# Patient Record
Sex: Male | Born: 1945 | Race: White | Hispanic: No | Marital: Married | Smoking: Former smoker
Health system: Southern US, Community
[De-identification: ages and names within clinical notes are randomized; demographics above are authoritative.]

## PROBLEM LIST (undated history)

## (undated) DIAGNOSIS — I82409 Acute embolism and thrombosis of unspecified deep veins of unspecified lower extremity: Secondary | ICD-10-CM

## (undated) DIAGNOSIS — I10 Essential (primary) hypertension: Secondary | ICD-10-CM

## (undated) DIAGNOSIS — E119 Type 2 diabetes mellitus without complications: Secondary | ICD-10-CM

## (undated) DIAGNOSIS — E785 Hyperlipidemia, unspecified: Secondary | ICD-10-CM

## (undated) DIAGNOSIS — G4730 Sleep apnea, unspecified: Secondary | ICD-10-CM

## (undated) DIAGNOSIS — K219 Gastro-esophageal reflux disease without esophagitis: Secondary | ICD-10-CM

## (undated) DIAGNOSIS — N390 Urinary tract infection, site not specified: Secondary | ICD-10-CM

## (undated) DIAGNOSIS — A4902 Methicillin resistant Staphylococcus aureus infection, unspecified site: Secondary | ICD-10-CM

## (undated) DIAGNOSIS — N529 Male erectile dysfunction, unspecified: Secondary | ICD-10-CM

## (undated) DIAGNOSIS — N400 Enlarged prostate without lower urinary tract symptoms: Secondary | ICD-10-CM

## (undated) DIAGNOSIS — H409 Unspecified glaucoma: Secondary | ICD-10-CM

## (undated) DIAGNOSIS — Z87442 Personal history of urinary calculi: Secondary | ICD-10-CM

## (undated) HISTORY — PX: COLONOSCOPY: SHX174

## (undated) HISTORY — PX: EYE SURGERY: SHX253

## (undated) HISTORY — PX: OTHER SURGICAL HISTORY: SHX169

---
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
## 2008-09-25 HISTORY — PX: PENILE PROSTHESIS IMPLANT: SHX240

## 2012-02-20 HISTORY — PX: EYE SURGERY: SHX253

## 2014-01-20 HISTORY — PX: EYE SURGERY: SHX253

## 2016-09-25 DIAGNOSIS — I82409 Acute embolism and thrombosis of unspecified deep veins of unspecified lower extremity: Secondary | ICD-10-CM

## 2016-09-25 HISTORY — DX: Acute embolism and thrombosis of unspecified deep veins of unspecified lower extremity: I82.409

## 2019-04-22 ENCOUNTER — Other Ambulatory Visit: Payer: Self-pay | Admitting: Urology

## 2019-04-22 DIAGNOSIS — R310 Gross hematuria: Secondary | ICD-10-CM

## 2019-04-22 DIAGNOSIS — R972 Elevated prostate specific antigen [PSA]: Secondary | ICD-10-CM

## 2019-04-30 ENCOUNTER — Ambulatory Visit: Payer: Self-pay

## 2019-05-02 ENCOUNTER — Ambulatory Visit: Payer: 59

## 2019-05-05 ENCOUNTER — Ambulatory Visit: Payer: 59

## 2019-07-04 ENCOUNTER — Inpatient Hospital Stay: Admission: RE | Admit: 2019-07-04 | Payer: 59 | Source: Ambulatory Visit

## 2019-07-09 NOTE — H&P (Signed)
NAME: Christopher Collier, Christopher Collier MEDICAL RECORD [id number] ACCOUNT [id number] DATE OF BIRTH:[birth year] FACILITY: [facility] LOCATION:  PHYSICIAN:Breia Ocampo Farrel Conners, MD  HISTORY AND PHYSICAL  DATE OF ADMISSION:  07/17/2019  CHIEF COMPLAINT:  Urinary retention and gross hematuria.  HISTORY OF PRESENT ILLNESS:  The patient is a 73 year old African-American male who presented to the office in July with a 46-month history of gross intermittent hematuria.  He also has had a long history of difficulty voiding with significant obstructive  symptoms.  He actually developed urinary retention and is performing intermittent self-cath.  Evaluation in the office included an elevated PSA of 5.6 ng/mL.  Cystoscopy on 08/19 indicated a 4 cm prostatic urethra with papillary lesions of the prostatic  urethra measuring 5 mm in size each.  Upper tract evaluation with CT scan on December 11th, 2020 indicated normal kidneys without stone, mass or obstruction.  He had some tiny renal calculi present.  He had some renal cysts.  The prostate appeared  heterogenous with enlargement and there appeared to be a fluid collection along the left side of the prostate.    Further evaluation with prostate ultrasound on 08/14 indicated an 83 gram prostate with a cystic lesion to the left side of the prostate.  Prostate gland MRI scan was then performed on 09/11 indicating a 107.9 gram prostate with a PI-RADS category 5  lesion measuring 4.9 cm in the left posterior lateral region extending from the base to the apex.  It also appeared to involve the urethra and appeared to penetrate the capsule.  There was also a 4 cm cystic structure to the left side of the prostate in  the rectovesical pouch of uncertain etiology.  Exosome IntelliScore was 33.3, which was above the cutoff for higher risk of grade prostate cancer.  The patient comes in now for UroNav fusion biopsy of the prostate, transurethral biopsy of the prostatic  urethral lesions and  transurethral resection of the prostate.  ALLERGIES:  THE PATIENT IS ALLERGIC TO LOTENSIN.    CURRENT MEDICATIONS:  Insulin, metoprolol, omeprazole, Xarelto and alfuzosin.  PAST SURGICAL HISTORY:  Cataract surgery 2013, placement of inflatable penile prosthesis 2010, cataract surgery 2015.  PAST AND CURRENT MEDICAL CONDITIONS:   1.  Hypertension. 2.  Hypercholesterolemia. 3.  Diabetes. 4.  History of kidney stones. 5.  Peripheral vascular disease. 6.  Sleep apnea. 7.  Obesity. 8.  Hypertension.  REVIEW OF SYSTEMS:  The patient has allergic rhinitis and decreased auditory acuity.  He has numbness and tingling of his feet.  He has some fatigue and weakness.  He denies chest pain or shortness of breath.  SOCIAL HISTORY:  The patient consumes 4 to 5 alcoholic beverages per week.  He quit smoking in 1971 with a 4-pack-year history.  FAMILY HISTORY:  Negative for prostate cancer and urological disease.  PHYSICAL EXAMINATION: VITAL SIGNS:  Height 5 feet 6 inches, weight 158, BMI 34. GENERAL:  A well-nourished African-American male in no distress. HEENT:  Sclerae were clear.  Pupils are equally round, reactive to light and accommodation. NECK:  No palpable cervical adenopathy. PULMONARY:  Lungs are clear to auscultation. CARDIOVASCULAR:  Regular rhythm and rate without audible murmurs. ABDOMEN:  Soft, nontender abdomen. GENITOURINARY:  Circumcised.  Penile prosthesis was easily cycled.  Testes were atrophic. RECTAL:  Greater than 40 g smooth, nontender prostate with a prominent left lobe. NEUROMUSCULAR:  Alert and oriented x3.  IMPRESSION: 1.  Gross hematuria. 2.  Prostatic urethral lesion. 3.  Benign prostatic hypertrophy with bladder outlet  obstruction. 4.  PI-RADS category 5 lesion of the prostate. 5.  Elevated PSA. 6.  Periprostatic a cystic lesion most likely representing penile prosthesis reservoir.  PLAN: 1.  Fusion biopsy of the prostate with the UroNav system, 2.   Transurethral resection of the prostate and biopsy of the prostatic urethral lesions.  TN/NUANCE  D:07/08/2019 T:07/08/2019 JOB:[id number]

## 2019-07-11 ENCOUNTER — Encounter
Admission: RE | Admit: 2019-07-11 | Discharge: 2019-07-11 | Disposition: A | Discharge status: Home / Self Care | Payer: 59 | Source: Ambulatory Visit | Attending: Urology | Admitting: Urology

## 2019-07-11 ENCOUNTER — Other Ambulatory Visit: Payer: 59

## 2019-07-11 ENCOUNTER — Other Ambulatory Visit: Payer: Self-pay

## 2019-07-11 DIAGNOSIS — Z01812 Encounter for preprocedural laboratory examination: Secondary | ICD-10-CM | POA: Diagnosis present

## 2019-07-11 HISTORY — DX: Type 2 diabetes mellitus without complications: E11.9

## 2019-07-11 HISTORY — DX: Gastro-esophageal reflux disease without esophagitis: K21.9

## 2019-07-11 HISTORY — DX: Benign prostatic hyperplasia without lower urinary tract symptoms: N40.0

## 2019-07-11 HISTORY — DX: Personal history of urinary calculi: Z87.442

## 2019-07-11 HISTORY — DX: Sleep apnea, unspecified: G47.30

## 2019-07-11 LAB — CBC
HCT: 38.9 % — ABNORMAL LOW (ref 39.0–52.0)
Hemoglobin: 13.3 g/dL (ref 13.0–17.0)
MCH: 31.2 pg (ref 26.0–34.0)
MCHC: 34.2 g/dL (ref 30.0–36.0)
MCV: 91.3 fL (ref 80.0–100.0)
Platelets: 242 10*3/uL (ref 150–400)
RBC: 4.26 MIL/uL (ref 4.22–5.81)
RDW: 11.9 % (ref 11.5–15.5)
WBC: 6.1 10*3/uL (ref 4.0–10.5)
nRBC: 0 % (ref 0.0–0.2)

## 2019-07-11 LAB — BASIC METABOLIC PANEL
Anion gap: 10 (ref 5–15)
BUN: 19 mg/dL (ref 8–23)
CO2: 29 mmol/L (ref 22–32)
Calcium: 9.1 mg/dL (ref 8.9–10.3)
Chloride: 98 mmol/L (ref 98–111)
Creatinine, Ser: 0.78 mg/dL (ref 0.61–1.24)
GFR calc Af Amer: >60 mL/min (ref >60)
GFR calc non Af Amer: >60 mL/min (ref >60)
Glucose, Bld: 296 mg/dL — ABNORMAL HIGH (ref 70–99)
Potassium: 4.2 mmol/L (ref 3.5–5.1)
Sodium: 137 mmol/L (ref 135–145)

## 2019-07-11 NOTE — Patient Instructions (Signed)
Your procedure is scheduled on: 07-17-19 THURSDAY Report to Same Day Surgery 2nd floor medical mall [facility] Entrance-take elevator on left to 2nd floor.  Check in with surgery information desk.) To find out your arrival time please call [phone] between 1PM - 3PM on 07-16-19 [facility]  Remember: Instructions that are not followed completely may result in serious medical risk, up to and including death, or upon the discretion of your surgeon and anesthesiologist your surgery may need to be rescheduled.    _x___ 1. Do not eat food after midnight the night before your procedure. NO GUM OR CANDY AFTER MIDNIGHT. You may drink WATER up to 2 hours before you are scheduled to arrive at the hospital for your procedure.  Do not drink WATER  within 2 hours of your scheduled arrival to the hospital.  Type 1 and type 2 diabetics should only drink water.   ____Ensure clear carbohydrate drink on the way to the hospital for bariatric patients  ____Ensure clear carbohydrate drink 3 hours before surgery.     __x__ 2. No Alcohol for 24 hours before or after surgery.   __x__3. No Smoking or e-cigarettes for 24 prior to surgery.  Do not use any chewable tobacco products for at least 6 hour prior to surgery   ____  4. Bring all medications with you on the day of surgery if instructed.    __x__ 5. Notify your doctor if there is any change in your medical condition     (cold, fever, infections).    x___6. On the morning of surgery brush your teeth with toothpaste and water.  You may rinse your mouth with mouth wash if you wish.  Do not swallow any toothpaste or mouthwash.   Do not wear jewelry, make-up, hairpins, clips or nail polish.  Do not wear lotions, powders, or perfumes. You may wear deodorant.  Do not shave 48 hours prior to surgery. Men may shave face and neck.  Do not bring valuables to the hospital.    [facility] is not responsible for any belongings or valuables.    Contacts, dentures or bridgework may not be worn into surgery.  Leave your suitcase in the car. After surgery it may be brought to your room.  For patients admitted to the hospital, discharge time is determined by your  treatment team.  _  Patients discharged the day of surgery will not be allowed to drive home.  You will need someone to drive you home and stay with you the night of your procedure.    Please read over the following fact sheets that you were given:   [facility] Preparing for Surgery   _x___ TAKE THE FOLLOWING MEDICATION THE MORNING OF SURGERY WITH A SMALL SIP OF WATER. These include:  1. UROXATRAL (ALFUZOSIN)  2. PROSCAR (FINASTERIDE)  3. METOPROLOL LOPRESSOR)  4. CRESTOR (ROSUVASTATIN)  5. PRILOSEC (OMEPRAZOLE)  6. TAKE AN EXTRA PRILOSEC THE NIGHT BEFORE YOUR SURGERY  _X___Fleets enema as directed-DO FLEET ENEMA AT HOME THE DAY OF SURGERY 1 HOUR PRIOR TO ARRIVAL TIME TO HOSPITAL  ____ Use CHG Soap or sage wipes as directed on instruction sheet   ____ Use inhalers on the day of surgery and bring to hospital day of surgery  ____ Stop Metformin and Janumet 2 days prior to surgery.    ____ Take 1/2 of usual insulin dose the night before surgery and none on the morning surgery.   _x___ Follow recommendations from Cardiologist, Pulmonologist or PCP regarding  stopping Aspirin, Coumadin, Plavix ,Eliquis, Effient, or Pradaxa, and Pletal-PT TO STOP XARELTO 1 WEEK PRIOR TO SURGERY  X____Stop Anti-inflammatories such as Advil, Aleve, Ibuprofen, Motrin, Naproxen, Naprosyn, Goodies powders or aspirin products NOW-OK to take Tylenol    ____ Stop supplements until after surgery.    ____ Bring C-Pap to the hospital.

## 2019-07-11 NOTE — Pre-Procedure Instructions (Signed)
NM myocardial perfusion SPECT multiple (stress and rest)06/24/2019 [place] Result Impression   normal lexiscan nuclear stress test. Normal left ventricular  systolic function, LVEF [id number], no focal wall motion abnormalities. No  ischemia or infarct. Compared with prior study there is no significant  change. Electronically Signed by:  Lenna Sciara. Newman Pies, MD [facility] 6:38 PM on 06/24/2019   Result Narrative  Procedure: Pharmacological Myocardial Perfusion Imaging with low level  exercise  1 day Rest/Stress procedure    Date of Visit:06/24/2019 Indication: Dyspnea on exertion, Diabetes mellitus type 1, uncontrolled,  insulin dependent (CMS-HCC)  Indication for Pharmacologic Stress: Very Low Exercise Tolerance. Ordering Physician: Wylene Simmer, DO Clinical History: 73 y.o. year old male Vitals:Height: 42 in Weight: 163 lb Breast/Chest Size: moderate Cardiac risk factors include: Diabetes Mellitus, hypertension,  hypercholesterolemia/hyperlipidemia, arrhythmia, age, syncope, severe OSA. CAD History: Stress Echo 06/20/16 @ [facility] - EF > 55% Normal. Rate related bbb. Normal  resting lv function. Moderate concentric lvh. Normal rv. Normal valves. Nuclear 05/19/04 @ [place] - EF 52% Normal wall motion. There is inferolateral  ST-segment depression in recovery that is less than 0.1 mV. This  ST-segment depression was associated with chest pain. Procedure: Pharmacologic stress testing was performed with Regadenoson  using a single use 0.4mg /66ml (0.08 mg/ml) prefilled syringe intravenously  infused over 10 seconds. The stress test was stopped due to Infusion  completion. Blood pressure response was normal. The patient did not  develop any symptoms other than fatigue during the procedure.   Rest HR: 77bpm Rest BP: 157/38mmHg Max HR: 106bpm Min BP: 150/35mmHg  ECG Interpretation: Rest ECG: normal sinus rhythm Stress ECG: normal sinus rhythm, premature ventricular  contractions Recovery ECG: normal sinus rhythm, isolated premature atrial contraction  ECG Interpretation: non-diagnostic due to pharmacologic testing. Stress Test Administered by: Ernesta Amble, MD  Myocardial perfusion imaging was performed at rest 60 minutes following  the intravenous injection of 5.7 mCi of Tc-36m Sestamibi in the right hand  on June 24, 2019. 10 - 20 seconds after the Lexiscan infusion, the  patient was intravenous injected with 17.9 mCi of Tc-56m Sestamibi  followed by a 5 ml saline flush in the right hand. Gated post-stress  perfusion imaging was performed 60 minutes after stress on June 24, 2019.   Gated LV Analysis:  TID Ratio= 1.02 EDV=91 ESV= 43 SV= 48 LVEF=53%  IMAGING FINDINGS: Regional wall motion: reveals normal myocardial thickening and wall  motion.  Artifacts noted: none Study quality: excellent Left ventricular cavity: normal.  Perfusion Analysis: SPECT images demonstrate homogeneous tracer  distribution throughout the myocardium.   Status Results Details   Encounter Summary

## 2019-07-11 NOTE — Pre-Procedure Instructions (Signed)
ECG 12-lead7/09/2018 [facility] Component Name Value Ref Range  Vent Rate (bpm) 84   PR Interval (msec) 144   QRS Interval (msec) 78   QT Interval (msec) 352   QTc (msec) 415   Other Result Information  This result has an attachment that is not available.  Result Narrative  Sinus rhythm with frequent premature ventricular complexes Nonspecific T wave abnormalities, lateral leads Nonspecific T wave abnormalities, anterior leads Abnormal ECG  When compared with ECG of 17-Nov-2016 16:55, No significant change was found I reviewed and concur with this report. Electronically signed [id number], MD, AUGUSTUS ([id number]) on 03/26/2019 4:17:23 PM  Status Results Details   Encounter Summary

## 2019-07-14 ENCOUNTER — Other Ambulatory Visit
Admission: RE | Admit: 2019-07-14 | Discharge: 2019-07-14 | Disposition: A | Discharge status: Home / Self Care | Payer: 59 | Source: Ambulatory Visit | Attending: Urology | Admitting: Urology

## 2019-07-14 ENCOUNTER — Other Ambulatory Visit: Payer: Self-pay

## 2019-07-14 DIAGNOSIS — Z20828 Contact with and (suspected) exposure to other viral communicable diseases: Secondary | ICD-10-CM | POA: Diagnosis not present

## 2019-07-14 DIAGNOSIS — Z01812 Encounter for preprocedural laboratory examination: Secondary | ICD-10-CM | POA: Diagnosis present

## 2019-07-14 LAB — SARS CORONAVIRUS 2 (TAT 6-24 HRS): SARS Coronavirus 2: NEGATIVE

## 2019-07-16 ENCOUNTER — Encounter: Payer: Self-pay | Admitting: Anesthesiology

## 2019-07-16 MED ORDER — GENTAMICIN IN SALINE 1.6-0.9 MG/ML-% IV SOLN
80.0000 mg | INTRAVENOUS | Status: AC
  Administered 2019-07-17: 80 mg via INTRAVENOUS
  Filled 2019-07-16: qty 50

## 2019-07-16 MED ORDER — GENTAMICIN SULFATE 40 MG/ML IJ SOLN
80.0000 mg | Freq: Once | INTRAVENOUS | Status: DC
  Filled 2019-07-16: qty 2

## 2019-07-16 MED ORDER — CEFAZOLIN SODIUM-DEXTROSE 1-4 GM/50ML-% IV SOLN
1.0000 g | Freq: Once | INTRAVENOUS | Status: AC
  Administered 2019-07-17: 2 g via INTRAVENOUS

## 2019-07-17 ENCOUNTER — Ambulatory Visit: Payer: 59 | Admitting: Anesthesiology

## 2019-07-17 ENCOUNTER — Ambulatory Visit
Admission: RE | Admit: 2019-07-17 | Discharge: 2019-07-17 | Disposition: A | Discharge status: Home / Self Care | Payer: 59 | Attending: Urology | Admitting: Urology

## 2019-07-17 ENCOUNTER — Encounter
Admission: RE | Disposition: A | Discharge status: Home / Self Care | Payer: Self-pay | Source: Home / Self Care | Attending: Urology

## 2019-07-17 ENCOUNTER — Other Ambulatory Visit: Payer: Self-pay

## 2019-07-17 ENCOUNTER — Encounter: Payer: Self-pay | Admitting: *Deleted

## 2019-07-17 DIAGNOSIS — Z79899 Other long term (current) drug therapy: Secondary | ICD-10-CM | POA: Insufficient documentation

## 2019-07-17 DIAGNOSIS — Z6834 Body mass index (BMI) 34.0-34.9, adult: Secondary | ICD-10-CM | POA: Insufficient documentation

## 2019-07-17 DIAGNOSIS — G4730 Sleep apnea, unspecified: Secondary | ICD-10-CM | POA: Diagnosis not present

## 2019-07-17 DIAGNOSIS — E7800 Pure hypercholesterolemia, unspecified: Secondary | ICD-10-CM | POA: Insufficient documentation

## 2019-07-17 DIAGNOSIS — N138 Other obstructive and reflux uropathy: Secondary | ICD-10-CM | POA: Insufficient documentation

## 2019-07-17 DIAGNOSIS — Z7901 Long term (current) use of anticoagulants: Secondary | ICD-10-CM | POA: Insufficient documentation

## 2019-07-17 DIAGNOSIS — E669 Obesity, unspecified: Secondary | ICD-10-CM | POA: Insufficient documentation

## 2019-07-17 DIAGNOSIS — C61 Malignant neoplasm of prostate: Secondary | ICD-10-CM | POA: Diagnosis not present

## 2019-07-17 DIAGNOSIS — I10 Essential (primary) hypertension: Secondary | ICD-10-CM | POA: Diagnosis not present

## 2019-07-17 DIAGNOSIS — R339 Retention of urine, unspecified: Secondary | ICD-10-CM

## 2019-07-17 DIAGNOSIS — Z888 Allergy status to other drugs, medicaments and biological substances status: Secondary | ICD-10-CM | POA: Diagnosis not present

## 2019-07-17 DIAGNOSIS — N401 Enlarged prostate with lower urinary tract symptoms: Secondary | ICD-10-CM | POA: Diagnosis present

## 2019-07-17 DIAGNOSIS — R310 Gross hematuria: Secondary | ICD-10-CM | POA: Diagnosis not present

## 2019-07-17 DIAGNOSIS — N4289 Other specified disorders of prostate: Secondary | ICD-10-CM | POA: Diagnosis not present

## 2019-07-17 DIAGNOSIS — E1151 Type 2 diabetes mellitus with diabetic peripheral angiopathy without gangrene: Secondary | ICD-10-CM | POA: Insufficient documentation

## 2019-07-17 DIAGNOSIS — D494 Neoplasm of unspecified behavior of bladder: Secondary | ICD-10-CM | POA: Insufficient documentation

## 2019-07-17 DIAGNOSIS — Z87891 Personal history of nicotine dependence: Secondary | ICD-10-CM | POA: Diagnosis not present

## 2019-07-17 DIAGNOSIS — K219 Gastro-esophageal reflux disease without esophagitis: Secondary | ICD-10-CM | POA: Insufficient documentation

## 2019-07-17 DIAGNOSIS — Z794 Long term (current) use of insulin: Secondary | ICD-10-CM | POA: Insufficient documentation

## 2019-07-17 DIAGNOSIS — R338 Other retention of urine: Secondary | ICD-10-CM | POA: Insufficient documentation

## 2019-07-17 HISTORY — PX: TRANSURETHRAL RESECTION OF PROSTATE: SHX73

## 2019-07-17 HISTORY — PX: PROSTATE BIOPSY: SHX241

## 2019-07-17 LAB — GLUCOSE, CAPILLARY
Glucose-Capillary: 182 mg/dL — ABNORMAL HIGH (ref 70–99)
Glucose-Capillary: 91 mg/dL (ref 70–99)

## 2019-07-17 SURGERY — TURP (TRANSURETHRAL RESECTION OF PROSTATE)
Anesthesia: General | Site: Prostate

## 2019-07-17 MED ORDER — FENTANYL CITRATE (PF) 100 MCG/2ML IJ SOLN
INTRAMUSCULAR | Status: AC
  Filled 2019-07-17: qty 2

## 2019-07-17 MED ORDER — ROCURONIUM BROMIDE 100 MG/10ML IV SOLN
INTRAVENOUS | Status: DC | PRN
  Administered 2019-07-17: 10 mg via INTRAVENOUS
  Administered 2019-07-17: 50 mg via INTRAVENOUS

## 2019-07-17 MED ORDER — PROPOFOL 10 MG/ML IV BOLUS
INTRAVENOUS | Status: DC | PRN
  Administered 2019-07-17: 100 mg via INTRAVENOUS

## 2019-07-17 MED ORDER — FENTANYL CITRATE (PF) 100 MCG/2ML IJ SOLN
25.0000 ug | INTRAMUSCULAR | Status: DC | PRN
  Administered 2019-07-17 (×4): 25 ug via INTRAVENOUS

## 2019-07-17 MED ORDER — FAMOTIDINE 20 MG PO TABS
20.0000 mg | ORAL_TABLET | Freq: Once | ORAL | Status: AC
  Administered 2019-07-17: 07:00:00 20 mg via ORAL

## 2019-07-17 MED ORDER — ONDANSETRON HCL 4 MG/2ML IJ SOLN: 4.0000 mg | Freq: Once | INTRAMUSCULAR | Status: DC | PRN

## 2019-07-17 MED ORDER — BELLADONNA ALKALOIDS-OPIUM 16.2-60 MG RE SUPP
RECTAL | Status: DC | PRN
  Administered 2019-07-17: 1 via RECTAL

## 2019-07-17 MED ORDER — SUGAMMADEX SODIUM 200 MG/2ML IV SOLN
INTRAVENOUS | Status: DC | PRN
  Administered 2019-07-17 (×2): 200 mg via INTRAVENOUS

## 2019-07-17 MED ORDER — LIDOCAINE HCL (CARDIAC) PF 100 MG/5ML IV SOSY
PREFILLED_SYRINGE | INTRAVENOUS | Status: DC | PRN
  Administered 2019-07-17: 80 mg via INTRAVENOUS

## 2019-07-17 MED ORDER — SODIUM CHLORIDE 0.9 % IV SOLN
INTRAVENOUS | Status: DC
  Administered 2019-07-17: 07:00:00 via INTRAVENOUS

## 2019-07-17 MED ORDER — CIPROFLOXACIN HCL 500 MG PO TABS: 500.0000 mg | ORAL_TABLET | Freq: Two times a day (BID) | ORAL | 0 refills | Status: DC

## 2019-07-17 MED ORDER — DIPHENHYDRAMINE HCL 25 MG PO CAPS
ORAL_CAPSULE | ORAL | Status: AC
  Filled 2019-07-17: qty 2

## 2019-07-17 MED ORDER — DOCUSATE SODIUM 100 MG PO CAPS: 200.0000 mg | ORAL_CAPSULE | Freq: Two times a day (BID) | ORAL | 3 refills | Status: AC

## 2019-07-17 MED ORDER — METOPROLOL TARTRATE 25 MG PO TABS
25.0000 mg | ORAL_TABLET | Freq: Once | ORAL | Status: AC
  Administered 2019-07-17: 07:00:00 25 mg via ORAL

## 2019-07-17 MED ORDER — LACTATED RINGERS IV SOLN
INTRAVENOUS | Status: DC | PRN
  Administered 2019-07-17: 14:00:00 via INTRAVENOUS

## 2019-07-17 MED ORDER — LIDOCAINE HCL URETHRAL/MUCOSAL 2 % EX GEL
CUTANEOUS | Status: DC | PRN
  Administered 2019-07-17: 1 via URETHRAL

## 2019-07-17 MED ORDER — MIDAZOLAM HCL 2 MG/2ML IJ SOLN
INTRAMUSCULAR | Status: DC | PRN
  Administered 2019-07-17: 2 mg via INTRAVENOUS

## 2019-07-17 MED ORDER — GEMCITABINE CHEMO FOR BLADDER INSTILLATION 2000 MG
2000.0000 mg | Freq: Once | INTRAVENOUS | Status: DC
  Filled 2019-07-17: qty 52.6

## 2019-07-17 MED ORDER — PHENYLEPHRINE HCL (PRESSORS) 10 MG/ML IV SOLN
INTRAVENOUS | Status: DC | PRN
  Administered 2019-07-17 (×3): 200 ug via INTRAVENOUS
  Administered 2019-07-17: 100 ug via INTRAVENOUS
  Administered 2019-07-17 (×2): 200 ug via INTRAVENOUS
  Administered 2019-07-17: 100 ug via INTRAVENOUS
  Administered 2019-07-17 (×2): 200 ug via INTRAVENOUS

## 2019-07-17 MED ORDER — METOPROLOL TARTRATE 25 MG PO TABS
ORAL_TABLET | ORAL | Status: AC
  Administered 2019-07-17: 25 mg via ORAL
  Filled 2019-07-17: qty 1

## 2019-07-17 MED ORDER — FLEET ENEMA 7-19 GM/118ML RE ENEM: 1.0000 | ENEMA | Freq: Once | RECTAL | Status: DC

## 2019-07-17 MED ORDER — PROPOFOL 10 MG/ML IV BOLUS
INTRAVENOUS | Status: AC
  Filled 2019-07-17: qty 40

## 2019-07-17 MED ORDER — BELLADONNA ALKALOIDS-OPIUM 16.2-60 MG RE SUPP
RECTAL | Status: AC
  Filled 2019-07-17: qty 1

## 2019-07-17 MED ORDER — ONDANSETRON HCL 4 MG/2ML IJ SOLN
INTRAMUSCULAR | Status: DC | PRN
  Administered 2019-07-17: 4 mg via INTRAVENOUS

## 2019-07-17 MED ORDER — ACETAMINOPHEN-CODEINE #3 300-30 MG PO TABS: 1.0000 | ORAL_TABLET | ORAL | 2 refills | Status: DC | PRN

## 2019-07-17 MED ORDER — PROPOFOL 500 MG/50ML IV EMUL
INTRAVENOUS | Status: AC
  Filled 2019-07-17: qty 50

## 2019-07-17 MED ORDER — URIBEL 118 MG PO CAPS: 1.0000 | ORAL_CAPSULE | Freq: Four times a day (QID) | ORAL | 3 refills | Status: DC | PRN

## 2019-07-17 MED ORDER — FENTANYL CITRATE (PF) 100 MCG/2ML IJ SOLN
INTRAMUSCULAR | Status: DC | PRN
  Administered 2019-07-17 (×2): 50 ug via INTRAVENOUS

## 2019-07-17 MED ORDER — MIDAZOLAM HCL 2 MG/2ML IJ SOLN
INTRAMUSCULAR | Status: AC
  Filled 2019-07-17: qty 2

## 2019-07-17 MED ORDER — CEFAZOLIN SODIUM-DEXTROSE 1-4 GM/50ML-% IV SOLN
INTRAVENOUS | Status: AC
  Filled 2019-07-17: qty 50

## 2019-07-17 MED ORDER — FAMOTIDINE 20 MG PO TABS
ORAL_TABLET | ORAL | Status: AC
  Administered 2019-07-17: 20 mg via ORAL
  Filled 2019-07-17: qty 1

## 2019-07-17 SURGICAL SUPPLY — 43 items
ADAPTER IRRIG TUBE 2 SPIKE SOL (ADAPTER) ×5 IMPLANT
BAG DRAIN CYSTO-URO LG1000N (MISCELLANEOUS) ×3 IMPLANT
BAG URINE DRAINAGE (UROLOGICAL SUPPLIES) ×3 IMPLANT
BAG URO DRAIN 4000ML (MISCELLANEOUS) ×1 IMPLANT
BRUSH SCRUB EZ PLAIN DRY (MISCELLANEOUS) ×2 IMPLANT
CATH FOL 2WAY LX 24X30 (CATHETERS) ×1 IMPLANT
CATH FOLEY 2WAY  5CC 20FR SIL (CATHETERS)
CATH FOLEY 2WAY 5CC 20FR SIL (CATHETERS) ×1 IMPLANT
CATH FOLEY 3WAY 30CC 24FR (CATHETERS)
CATH URTH STD 24FR FL 3W 2 (CATHETERS) ×1 IMPLANT
CORD URO TURP 10FT (MISCELLANEOUS) ×1 IMPLANT
COVER MAYO STAND REUSABLE (DRAPES) ×1 IMPLANT
COVER WAND RF STERILE (DRAPES) ×1 IMPLANT
DRSG TELFA 4X3 1S NADH ST (GAUZE/BANDAGES/DRESSINGS) ×3 IMPLANT
ELECT LOOP 22F BIPOLAR SML (ELECTROSURGICAL) ×3
ELECT REM PT RETURN 9FT ADLT (ELECTROSURGICAL)
ELECTRODE LOOP 22F BIPOLAR SML (ELECTROSURGICAL) ×1 IMPLANT
ELECTRODE REM PT RTRN 9FT ADLT (ELECTROSURGICAL) ×1 IMPLANT
GLOVE BIO SURGEON STRL SZ7 (GLOVE) ×6 IMPLANT
GLOVE BIO SURGEON STRL SZ7.5 (GLOVE) ×3 IMPLANT
GOWN STRL REUS W/ TWL LRG LVL3 (GOWN DISPOSABLE) ×2 IMPLANT
GOWN STRL REUS W/ TWL LRG LVL4 (GOWN DISPOSABLE) ×3 IMPLANT
GOWN STRL REUS W/ TWL XL LVL3 (GOWN DISPOSABLE) ×1 IMPLANT
GOWN STRL REUS W/TWL LRG LVL3 (GOWN DISPOSABLE) ×1
GOWN STRL REUS W/TWL LRG LVL4 (GOWN DISPOSABLE) ×2
GOWN STRL REUS W/TWL XL LVL3 (GOWN DISPOSABLE)
GUIDE NDL ENDOCAV 16-18 CVR (NEEDLE) IMPLANT
GUIDE NEEDLE ENDOCAV 16-18 CVR (NEEDLE) IMPLANT
INST BIOPSY MAXCORE 18GX25 (NEEDLE) ×3 IMPLANT
IV SET PRIMARY 15D 139IN B9900 (IV SETS) ×3 IMPLANT
KIT TURNOVER CYSTO (KITS) ×3 IMPLANT
LOOP CUT BIPOLAR 24F LRG (ELECTROSURGICAL) ×1 IMPLANT
NDL GUIDE BIOPSY 644068 (NEEDLE) IMPLANT
NEEDLE GUIDE BIOPSY 644068 (NEEDLE) IMPLANT
PACK CYSTO AR (MISCELLANEOUS) ×3 IMPLANT
SET IRRIG Y TYPE TUR BLADDER L (SET/KITS/TRAYS/PACK) ×4 IMPLANT
SET IRRIGATING DISP (SET/KITS/TRAYS/PACK) ×1 IMPLANT
SOL .9 NS 3000ML IRR  AL (IV SOLUTION) ×4
SOL .9 NS 3000ML IRR UROMATIC (IV SOLUTION) ×8 IMPLANT
SURGILUBE 2OZ TUBE FLIPTOP (MISCELLANEOUS) ×5 IMPLANT
SYRINGE IRR TOOMEY STRL 70CC (SYRINGE) ×3 IMPLANT
TOWEL OR 17X26 4PK STRL BLUE (TOWEL DISPOSABLE) ×3 IMPLANT
WATER STERILE IRR 1000ML POUR (IV SOLUTION) ×3 IMPLANT

## 2019-07-17 NOTE — Anesthesia Procedure Notes (Signed)
Procedure Name: Intubation Date/Time: 07/17/2019 12:40 PM Performed by: Leander Rams, CRNA Pre-anesthesia Checklist: Patient identified, Emergency Drugs available, Suction available, Patient being monitored and Timeout performed Patient Re-evaluated:Patient Re-evaluated prior to induction Oxygen Delivery Method: Circle system utilized Preoxygenation: Pre-oxygenation with 100% oxygen Induction Type: IV induction Ventilation: Mask ventilation without difficulty Laryngoscope Size: McGraph and 4 Grade View: Grade I Tube type: Oral Tube size: 7.5 mm Number of attempts: 1 Airway Equipment and Method: Stylet Placement Confirmation: ETT inserted through vocal cords under direct vision Secured at: 23 cm Tube secured with: Tape Dental Injury: Teeth and Oropharynx as per pre-operative assessment

## 2019-07-17 NOTE — Op Note (Signed)
Preoperative diagnosis: 1.  Elevated PSA                                             2.  Gross hematuria                                              3.  Urinary retention                                              4.  Urothelial tumor of the prostatic urethra                                              5.  PI-RADS category 5 lesion of the prostate  Postoperative diagnosis: Same  Procedure: 1.  Transurethral resection of the prostate                      2.  Uronav fusion transrectal ultrasound-guided biopsy the prostate  Surgeon: Otelia Limes. Yves Dill MD  Anesthesia: General  Indications:See the history and physical. After informed consent the above procedure(s) were requested     Technique and findings:After adequate general anesthesia been obtained the patient was placed into the lateral decubitus position and finger sweep of the rectum performed.  Rectal vault was noted to be clean.  The transrectal ultrasound probe was then placed and ultrasound images acquired for the Uronav device.  The ultrasound images were then fused with the MRI images.  The PI-RADS 5 and was identified and 4 core biopsies taken from this lesion.  At this point standard 12 core systematic biopsies were performed.  Ultrasound findings included a massively enlarged prostate greater than 100 cc and a cystic perirectal mass with internal echoes on the left side.  The seminal vesicles were dilated bilaterally.  At this point the ultrasound probe was removed and patient was placed into dorsal lithotomy position.  The 24 French resectoscope was then coupled to the camera and visually advanced into the bladder.  Bladder was thoroughly inspected and no lesions were identified.  Both ureteral orifices were identified and had clear drainage.  The prostate was visually obstructing with lateral lobe hypertrophy.  There was a papillary tumor near the apex extending to the veru montanum at the 6 o'clock position.  The papillary tumor was  then resected with the loop and fragments submitted to pathology.  This point the prostatic tissue was resected with the loop in the bladder neck to the veru.  The prostatic tissue near the Vero posteriorly appeared to be necrotic and papillar nature and was consistent with transitional cell carcinoma invading the prostate.  All prostatic fragments were then submitted to pathology.  Bleeders were attended to with the rollerball electrode.  At this point the resectoscope was removed and 10 cc of viscous Xylocaine instilled within the urethra.  A 22 French Foley catheter was placed and irrigated until clear.  A B&O suppository was placed.  Blood loss was approximately 100 cc.  The procedure was then terminated and patient transferred to the recovery room in stable condition.

## 2019-07-17 NOTE — Anesthesia Postprocedure Evaluation (Signed)
Anesthesia Post Note  Patient: Seena Zimmerer  Procedure(s) Performed: TRANSURETHRAL RESECTION OF THE PROSTATE (TURP) (N/A Prostate) PROSTATE BIOPSY URO NAV FUSION (N/A Prostate)  Patient location during evaluation: PACU Anesthesia Type: General Level of consciousness: awake and alert Pain management: pain level controlled Vital Signs Assessment: post-procedure vital signs reviewed and stable Respiratory status: spontaneous breathing, nonlabored ventilation, respiratory function stable and patient connected to nasal cannula oxygen Cardiovascular status: blood pressure returned to baseline and stable Postop Assessment: no apparent nausea or vomiting Anesthetic complications: no     Last Vitals:  Vitals:   07/17/19 1602 07/17/19 1615  BP: (!) 140/91 (!) 150/92  Pulse: 78 78  Resp: 14 16  Temp: (!) 36.3 C (!) 36.4 C  SpO2: 97% 100%    Last Pain:  Vitals:   07/17/19 1615  TempSrc: Tympanic  PainSc: 0-No pain                 Dakin Madani S

## 2019-07-17 NOTE — Transfer of Care (Signed)
Immediate Anesthesia Transfer of Care Note  Patient: Christopher Collier  Procedure(s) Performed: TRANSURETHRAL RESECTION OF THE PROSTATE (TURP) (N/A Prostate) PROSTATE BIOPSY URO NAV FUSION (N/A Prostate)  Patient Location: PACU  Anesthesia Type:General  Level of Consciousness: awake  Airway & Oxygen Therapy: Patient Spontanous Breathing  Post-op Assessment: Report given to RN  Post vital signs: stable  Last Vitals:  Vitals Value Taken Time  BP 122/88 07/17/19 1508  Temp    Pulse 69 07/17/19 1509  Resp 21 07/17/19 1509  SpO2 96 % 07/17/19 1509  Vitals shown include unvalidated device data.  Last Pain:  Vitals:   07/17/19 0631  TempSrc: Oral  PainSc: 5          Complications: No apparent anesthesia complications

## 2019-07-17 NOTE — Progress Notes (Signed)
Patient has sleep apnea, does not use CPAP.

## 2019-07-17 NOTE — H&P (Signed)
Date of Initial H&P: 07/08/19  History reviewed, patient examined, no change in status, stable for surgery.  

## 2019-07-17 NOTE — Anesthesia Post-op Follow-up Note (Signed)
Anesthesia QCDR form completed.        

## 2019-07-17 NOTE — Progress Notes (Signed)
Ch visited pt while rounding in pre-op. Pt shared that he was here today for a biopsy regarding his prostate that is enlarged. The pt has been retired for a year and shared that when he did retire, his health started to change. The pt has a wife that is present to support him. Ch provided words of comfort and social support while pt waited to be taken to the surgical area. Pt c/o the long wait but had a positive affect otherwise. Pt appreciated the visit.  No further needs at this time.    07/17/19 0900  Clinical Encounter Type  Visited With Patient  Visit Type Social support;Pre-op  Spiritual Encounters  Spiritual Needs Emotional  Stress Factors  Patient Stress Factors Health changes  Family Stress Factors None identified

## 2019-07-17 NOTE — Anesthesia Preprocedure Evaluation (Signed)
Anesthesia Evaluation  Patient identified by MRN, date of birth, ID band Patient awake    Reviewed: Allergy & Precautions, NPO status , Patient's Chart, lab work & pertinent test results, reviewed documented beta blocker date and time   Airway Mallampati: II  TM Distance: >3 FB     Dental  (+) Chipped   Pulmonary sleep apnea , former smoker,           Cardiovascular + DVT       Neuro/Psych    GI/Hepatic GERD  Controlled,  Endo/Other  diabetes, Type 2  Renal/GU      Musculoskeletal   Abdominal   Peds  Hematology   Anesthesia Other Findings   Reproductive/Obstetrics                             Anesthesia Physical Anesthesia Plan  ASA: III  Anesthesia Plan: General   Post-op Pain Management:    Induction: Intravenous  PONV Risk Score and Plan:   Airway Management Planned: LMA  Additional Equipment:   Intra-op Plan:   Post-operative Plan:   Informed Consent: I have reviewed the patients History and Physical, chart, labs and discussed the procedure including the risks, benefits and alternatives for the proposed anesthesia with the patient or authorized representative who has indicated his/her understanding and acceptance.       Plan Discussed with: CRNA  Anesthesia Plan Comments:         Anesthesia Quick Evaluation

## 2019-07-18 ENCOUNTER — Encounter: Payer: Self-pay | Admitting: Urology

## 2019-07-23 ENCOUNTER — Encounter: Payer: Self-pay | Admitting: Urology

## 2019-07-23 LAB — SURGICAL PATHOLOGY

## 2020-06-02 ENCOUNTER — Other Ambulatory Visit: Admission: RE | Admit: 2020-06-02 | Payer: 59 | Source: Ambulatory Visit

## 2020-06-02 NOTE — H&P (Addendum)
PATIENT PROFILE: Christopher Collier is a 74 y.o. male who presents to the [place] department for consultation at the request of Dr. Yves Collier for evaluation of diarrhea  HISTORY OF PRESENT ILLNESS: Mr. Christopher Collier reports he is well- states he is taking iron for his chronic anemia- did have a blood transfusion for hemoglobin of 7.9 while hospitalized for PE last April- his iron and ferritin were also noted to be low.  He was not anticoagulated at that time due to significant hematuria then, but had  IVC filter placed. Has been seen by urology (Dr Christopher Collier) in follow up of hematuria/history of prostate cancer (TURP 10/20, on eligard and casodex)  It was thought at the time of urology follow up visit that his hematuria was coming from the fossa of his penile prosthesis. He was treated with some levaquin at that time and is to follow up next month. He has not had any GI eval for the IDA. Had a GU protocol CT while hospitalized with the Impression: 1. Persistent heterogeneous enlarged prostate with apparent mass in region of the left posterolateral peripheral zone, concerning for prostate malignancy. Decreased size of adjacent cystic lesion along left rectum, recommend pelvic MRI for further evaluation 2. Contrast extending posteriorly from bladder into the prostate, possibly related to transurethral resection of the prostate, correlate with surgical history. 3. Evaluation for renal or ureteral stones limited by contrast in the collecting system from prior CT. No enhancing renal mass identified. No Hydronephrosis  He reports he has problems with irregular bowel habits- sometimes has small hard stools, sometimes has some diarrheal stool- states this is weekly. Describes he will not have bm for 2-3d, starts moving bowels and then stools get looser/diarrheal. There is some incomplete and difficult defecation at times. There is some intermittent abdominal pain in the stomach that doesn't last long  described as sharp but resolves spontaneously- not daily- points to the umbilical area as location. There is rectal pain when he sits. Denies any NV, heartburn, difficulty swallowing, further GI concerns. States he has not seen any bloody stools. Stools have been rather dark since starting iron.  Brother had pancreatic cancer There is no family history of colorectal cancer that patient is aware of. Recent hgb 9.7 (03/05/20) bmp unremarkable 02/19/20  Last colonoscopy: 2012- IDA/melena- no polyps/bleeding sites Last endoscopy: 2012- ida/melena- noted to be a normal exam. VCE 2012- unremarkable exam He is diabetic  GENERAL REVIEW OF SYSTEMS:   10 systems reviewed, unremarkable other than what is written above, with the exception of arthralgias, unsteady gait, uses walker to aid with ambulation.   MEDICATIONS: Encounter Medications        Outpatient Encounter Medications as of 03/15/2020  Medication Sig Dispense Refill  . bicalutamide (CASODEX) 50 MG tablet Take 50 mg by mouth once daily    . blood glucose diagnostic, drum (ACCU-CHEK COMPACT TEST) test strip Use 2 (two) times daily Use as instructed. (Patient not taking: Reported on 02/05/2020  ) 200 each 3  . docusate (COLACE) 100 MG capsule Take 200 mg by mouth 2 (two) times daily    . ELIGARD, 6 MONTH, 45 mg injection Inject 45 mg subcutaneously every 6 (six) months    . flash glucose scanning reader (FREESTYLE LIBRE 14 DAY READER) Misc Use 1 Application once 1 each 0  . flash glucose sensor (FREESTYLE LIBRE 14 DAY SENSOR) kit for glucose monitoring 2 kit 11  . glucose chew tablet 4 gram chewable tablet Take 4 tablets (16 g  total) by mouth as needed for Low blood sugar 50 tablet 0  . insulin ASPART PROTAMINE-ASPART (NOVOLOG MIX 70-30 U-100 INSULN) 100 unit/mL (70-30) injection Take 23 units with breakfast and 23 units with dinner. 20 mL 11  . insulin syringe-needle U-100 0.3 mL 31 gauge x 15/64" Syrg Use 1 Syringe 4 (four) times  daily 365 Syringe 0  . MELATONIN ORAL Take 1 tablet by mouth nightly    . metoprolol tartrate (LOPRESSOR) 25 MG tablet     . oxybutynin (DITROPAN-XL) 10 MG XL tablet Take 10 mg by mouth once daily    . rosuvastatin (CRESTOR) 5 MG tablet      No facility-administered encounter medications on file as of 03/15/2020.      ALLERGIES: Lotensin [benazepril]  PAST MEDICAL HISTORY:     Past Medical History:  Diagnosis Date  . BPH (benign prostatic hyperplasia)   . Diabetes mellitus type 2, uncomplicated (CMS-HCC)   . Erectile dysfunction 03/06/2012  . GERD (gastroesophageal reflux disease)   . Glaucoma   . Hyperlipidemia   . Hypertension     PAST SURGICAL HISTORY:      Past Surgical History:  Procedure Laterality Date  . CATARACT EXTRACTION     both eyes-2013,2015  . INSERTION PENILE PROSTHESIS    . LENS EYE SURGERY Left 02/20/2012   crystalens no lensx   . LENS EYE SURGERY Right 01/20/2014   crystalens no lensx   . PRP     PRP x 2 OD (2009) PRP x 2 OS (2010)     FAMILY HISTORY:      Family History  Problem Relation Age of Onset  . Diabetes type II Mother   . Diabetes type II Sister   . Stomach cancer Brother   . Diabetes type II Sister   . Diabetes type II Sister   . Diabetes type II Brother   . Prostate cancer Brother   . Coronary Artery Disease (Blocked arteries around heart) Neg Hx   . Kidney cancer Neg Hx      SOCIAL HISTORY: Social History          Socioeconomic History  . Marital status: Married    Spouse name: Christopher Collier  . Number of children: 4  . Years of education: Not on file  . Highest education level: Not on file  Occupational History  . 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 Smoking status: Former Smoker    Quit date: 02/10/1971    Years since quitting: 49.1  . Smokeless tobacco: Never Used  Vaping Use  . Vaping Use: Unknown  Substance and Sexual Activity  . Alcohol use: Yes     Alcohol/week: 4.0 - 7.0 standard drinks    Types: 1 - 4 Glasses of wine, 3 Cans of beer per week    Comment: 2-3 times a week.  . Drug use: No    Comment: married; mail carrier   . Sexual activity: Not Currently    Partners: Female  Other Topics Concern  . Not on file  Social History Narrative   He has 4 children, 1 grandson.   He lives in [place], [place].    he does not exercise regularly.   Social Determinants of Health      Financial Resource Strain:   . Difficulty of Paying Living Expenses:   Food Insecurity:   . Worried About Charity fundraiser in the Last Year:   . [place] in the Last Year:  Transportation Needs:   . Film/video editor (Medical):   Marland Kitchen Lack of Transportation (Non-Medical):       PHYSICAL EXAM:    Vitals:   03/15/20 1101  BP: 110/60  Pulse: 89  Temp: 36.4 C (97.5 F)   Body mass index is 27.85 kg/m. Weight: 78 kg (172 lb)   General Appearance:    Alert, cooperative, no distress, appears stated age  Head:     Atraumatic, normocephalic  Eyes:   Anciteric  Neck:   Supple, symmetrical, trachea midline, no adenopathy, no thyroid enlargement; no JVD  Throat:   Lips, mucosa, and tongue normal; teeth and gums normal  Lungs:     Clear to auscultation bilaterally, respirations unlabored   Heart:    Regular rate and rhythm, S1 and S2 normal, no murmur, rub   or gallop  Abdomen:     Soft, non-tender, bowel sounds active all four quadrants,    no masses, no organomegaly Rectal: no external abnormalities. There is an angular enlarged firm prostate, nontender. Stool brown, heme negative.  Extremities:   Extremities normal, atraumatic, no cyanosis or edema  Skin:   Skin color, texture, turgor normal, no rashes or lesions   Neurologic:   Grossly intact    REVIEW OF DATA: I have reviewed the following data today:      Appointment on 03/05/2020  Component Date Value  . WBC (White Blood Cell Co* 03/05/2020 5.7   . Hemoglobin  03/05/2020 9.7*  . Hematocrit 03/05/2020 30.2*  . Platelets 03/05/2020 260   . MCV (Mean Corpuscular Vo* 03/05/2020 93   . MCH (Mean Corpuscular He* 03/05/2020 29.8   . MCHC (Mean Corpuscular H* 03/05/2020 32.1   . RBC (Red Blood Cell Coun* 03/05/2020 3.26*  . RDW-CV (Red Cell Distrib* 03/05/2020 16.2*  . MPV (Mean Platelet Volum* 03/05/2020 9.0   Ancillary Orders on 02/19/2020  Component Date Value  . Sodium 02/19/2020 139   . Potassium 02/19/2020 4.1   . Chloride 02/19/2020 107   . Carbon Dioxide (CO2) 02/19/2020 23   . Urea Nitrogen (BUN) 02/19/2020 8   . Creatinine 02/19/2020 0.8   . Glucose 02/19/2020 78   . Calcium 02/19/2020 9.0   . Anion Gap 02/19/2020 9   . BUN/CREA Ratio 02/19/2020 10   . Glomerular Filtration Ra* 02/19/2020 103   . WBC (White Blood Cell Co* 02/19/2020 6.3   . Hemoglobin 02/19/2020 9.3*  . Hematocrit 02/19/2020 29.6*  . Platelets 02/19/2020 261   . MCV (Mean Corpuscular Vo* 02/19/2020 91   . MCH (Mean Corpuscular He* 02/19/2020 28.6   . MCHC (Mean Corpuscular H* 02/19/2020 31.4*  . RBC (Red Blood Cell Coun* 02/19/2020 3.25*  . RDW-CV (Red Cell Distrib* 02/19/2020 15.7*  . MPV (Mean Platelet Volum* 02/19/2020 9.0   No results displayed because visit has over 200 results.      ASSESSMENT AND PLAN: Mr. Hornstein is a 75 y.o. male presenting for consultation for diarrhea. 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 States he is taking benefiber already on a daily basis, I am recommending he take some daily miralax  (1 dose).  He also has IDA - likely related to hematuria but patient also has concerns of rectal pressure, intermittent abdominal pain, bowel habit changes. Schedule EGD and colonoscopy.  Procedure information given: indications, benefits, risks- including, but not limited to bleeding, infection, perforation, difficulty with sedation, were discussed with the patient, and they are agreeable to the procedure  In regards  to pelvic ct findings 4/20 of cystic lesion adjacent to rectum, I have reviewed pelvic mri from 9/20 and the cystic lesion adjacent the left rectum has shrunk- and he is following with urology for his prostate cancer so will defer to their judgement in regards to this. His angular prostate may be consequence of his surgery last October. I am unsure if this contributes to his rectal pressure. Follow up 2-3w after procedures, sooner if problems   Ok Edwards, NP  Gastroenterology  Iron deficiency anemia, unspecified iron deficiency anemia type +2 more

## 2020-06-03 ENCOUNTER — Other Ambulatory Visit: Payer: Self-pay

## 2020-06-03 ENCOUNTER — Other Ambulatory Visit
Admission: RE | Admit: 2020-06-03 | Discharge: 2020-06-03 | Disposition: A | Discharge status: Home / Self Care | Payer: 59 | Source: Ambulatory Visit | Attending: General Surgery | Admitting: General Surgery

## 2020-06-03 ENCOUNTER — Encounter: Payer: Self-pay | Admitting: General Surgery

## 2020-06-03 DIAGNOSIS — Z01812 Encounter for preprocedural laboratory examination: Secondary | ICD-10-CM | POA: Insufficient documentation

## 2020-06-03 DIAGNOSIS — Z20822 Contact with and (suspected) exposure to covid-19: Secondary | ICD-10-CM | POA: Diagnosis not present

## 2020-06-04 ENCOUNTER — Ambulatory Visit: Payer: 59 | Admitting: Anesthesiology

## 2020-06-04 ENCOUNTER — Encounter
Admission: RE | Disposition: A | Discharge status: Home / Self Care | Payer: Self-pay | Source: Home / Self Care | Attending: General Surgery

## 2020-06-04 ENCOUNTER — Ambulatory Visit
Admission: RE | Admit: 2020-06-04 | Discharge: 2020-06-04 | Disposition: A | Discharge status: Home / Self Care | Payer: 59 | Attending: General Surgery | Admitting: General Surgery

## 2020-06-04 ENCOUNTER — Other Ambulatory Visit: Payer: Self-pay

## 2020-06-04 DIAGNOSIS — N400 Enlarged prostate without lower urinary tract symptoms: Secondary | ICD-10-CM | POA: Diagnosis not present

## 2020-06-04 DIAGNOSIS — Z8546 Personal history of malignant neoplasm of prostate: Secondary | ICD-10-CM | POA: Insufficient documentation

## 2020-06-04 DIAGNOSIS — Z794 Long term (current) use of insulin: Secondary | ICD-10-CM | POA: Diagnosis not present

## 2020-06-04 DIAGNOSIS — Z9079 Acquired absence of other genital organ(s): Secondary | ICD-10-CM | POA: Insufficient documentation

## 2020-06-04 DIAGNOSIS — D509 Iron deficiency anemia, unspecified: Secondary | ICD-10-CM | POA: Diagnosis present

## 2020-06-04 DIAGNOSIS — Z87891 Personal history of nicotine dependence: Secondary | ICD-10-CM | POA: Insufficient documentation

## 2020-06-04 DIAGNOSIS — Z87442 Personal history of urinary calculi: Secondary | ICD-10-CM | POA: Insufficient documentation

## 2020-06-04 DIAGNOSIS — Z9842 Cataract extraction status, left eye: Secondary | ICD-10-CM | POA: Insufficient documentation

## 2020-06-04 DIAGNOSIS — Z79899 Other long term (current) drug therapy: Secondary | ICD-10-CM | POA: Insufficient documentation

## 2020-06-04 DIAGNOSIS — Z5309 Procedure and treatment not carried out because of other contraindication: Secondary | ICD-10-CM | POA: Diagnosis not present

## 2020-06-04 DIAGNOSIS — Z9689 Presence of other specified functional implants: Secondary | ICD-10-CM | POA: Insufficient documentation

## 2020-06-04 DIAGNOSIS — E785 Hyperlipidemia, unspecified: Secondary | ICD-10-CM | POA: Diagnosis not present

## 2020-06-04 DIAGNOSIS — E1139 Type 2 diabetes mellitus with other diabetic ophthalmic complication: Secondary | ICD-10-CM | POA: Insufficient documentation

## 2020-06-04 DIAGNOSIS — H42 Glaucoma in diseases classified elsewhere: Secondary | ICD-10-CM | POA: Insufficient documentation

## 2020-06-04 DIAGNOSIS — G4730 Sleep apnea, unspecified: Secondary | ICD-10-CM | POA: Diagnosis not present

## 2020-06-04 DIAGNOSIS — K222 Esophageal obstruction: Secondary | ICD-10-CM | POA: Insufficient documentation

## 2020-06-04 DIAGNOSIS — Z7901 Long term (current) use of anticoagulants: Secondary | ICD-10-CM | POA: Diagnosis not present

## 2020-06-04 DIAGNOSIS — I10 Essential (primary) hypertension: Secondary | ICD-10-CM | POA: Insufficient documentation

## 2020-06-04 DIAGNOSIS — Z8042 Family history of malignant neoplasm of prostate: Secondary | ICD-10-CM | POA: Insufficient documentation

## 2020-06-04 DIAGNOSIS — Z86718 Personal history of other venous thrombosis and embolism: Secondary | ICD-10-CM | POA: Diagnosis not present

## 2020-06-04 DIAGNOSIS — K6289 Other specified diseases of anus and rectum: Secondary | ICD-10-CM | POA: Insufficient documentation

## 2020-06-04 DIAGNOSIS — Z8249 Family history of ischemic heart disease and other diseases of the circulatory system: Secondary | ICD-10-CM | POA: Insufficient documentation

## 2020-06-04 DIAGNOSIS — K529 Noninfective gastroenteritis and colitis, unspecified: Secondary | ICD-10-CM | POA: Diagnosis not present

## 2020-06-04 DIAGNOSIS — Z833 Family history of diabetes mellitus: Secondary | ICD-10-CM | POA: Insufficient documentation

## 2020-06-04 DIAGNOSIS — Z800 Family history of malignant neoplasm of digestive organs: Secondary | ICD-10-CM | POA: Insufficient documentation

## 2020-06-04 DIAGNOSIS — Z9841 Cataract extraction status, right eye: Secondary | ICD-10-CM | POA: Insufficient documentation

## 2020-06-04 DIAGNOSIS — K219 Gastro-esophageal reflux disease without esophagitis: Secondary | ICD-10-CM | POA: Diagnosis not present

## 2020-06-04 DIAGNOSIS — Z888 Allergy status to other drugs, medicaments and biological substances status: Secondary | ICD-10-CM | POA: Insufficient documentation

## 2020-06-04 HISTORY — DX: Male erectile dysfunction, unspecified: N52.9

## 2020-06-04 HISTORY — PX: COLONOSCOPY WITH PROPOFOL: SHX5780

## 2020-06-04 HISTORY — DX: Unspecified glaucoma: H40.9

## 2020-06-04 HISTORY — DX: Hyperlipidemia, unspecified: E78.5

## 2020-06-04 HISTORY — PX: ESOPHAGOGASTRODUODENOSCOPY: SHX5428

## 2020-06-04 HISTORY — DX: Essential (primary) hypertension: I10

## 2020-06-04 LAB — SARS CORONAVIRUS 2 (TAT 6-24 HRS): SARS Coronavirus 2: NEGATIVE

## 2020-06-04 LAB — GLUCOSE, CAPILLARY: Glucose-Capillary: 313 mg/dL — ABNORMAL HIGH (ref 70–99)

## 2020-06-04 SURGERY — COLONOSCOPY WITH PROPOFOL
Anesthesia: General

## 2020-06-04 MED ORDER — SODIUM CHLORIDE 0.9 % IV SOLN: INTRAVENOUS | Status: DC

## 2020-06-04 MED ORDER — PROPOFOL 500 MG/50ML IV EMUL
INTRAVENOUS | Status: AC
  Filled 2020-06-04: qty 50

## 2020-06-04 MED ORDER — LIDOCAINE HCL (CARDIAC) PF 100 MG/5ML IV SOSY
PREFILLED_SYRINGE | INTRAVENOUS | Status: DC | PRN
  Administered 2020-06-04: 30 mg via INTRAVENOUS

## 2020-06-04 MED ORDER — LIDOCAINE HCL (PF) 2 % IJ SOLN
INTRAMUSCULAR | Status: AC
  Filled 2020-06-04: qty 5

## 2020-06-04 MED ORDER — INSULIN ASPART 100 UNIT/ML ~~LOC~~ SOLN
5.0000 [IU] | Freq: Once | SUBCUTANEOUS | Status: AC
  Administered 2020-06-04: 5 [IU] via SUBCUTANEOUS

## 2020-06-04 MED ORDER — PROPOFOL 10 MG/ML IV BOLUS
INTRAVENOUS | Status: DC | PRN
  Administered 2020-06-04: 50 mg via INTRAVENOUS
  Administered 2020-06-04: 20 mg via INTRAVENOUS
  Administered 2020-06-04: 30 mg via INTRAVENOUS
  Administered 2020-06-04 (×2): 20 mg via INTRAVENOUS

## 2020-06-04 MED ORDER — PROPOFOL 500 MG/50ML IV EMUL
INTRAVENOUS | Status: DC | PRN
  Administered 2020-06-04: 125 ug/kg/min via INTRAVENOUS

## 2020-06-04 MED ORDER — INSULIN ASPART 100 UNIT/ML ~~LOC~~ SOLN
SUBCUTANEOUS | Status: AC
  Filled 2020-06-04: qty 1

## 2020-06-04 NOTE — H&P (Signed)
Christopher Collier [id number] 01-27-1946     HPI:  Patient with diarrhea and iron deficiency anemia.  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  For EGD/ colonoscopy.  Tolerated prep well.    Medications Prior to Admission  Medication Sig Dispense Refill Last Dose  . bicalutamide (CASODEX) 50 MG tablet Take 50 mg by mouth daily.     Marland Kitchen leuprolide, 6 Month, (ELIGARD) 45 MG injection Inject 45 mg into the skin every 6 (six) months.     Marland Kitchen MELATONIN PO Take by mouth at bedtime.     . metoprolol tartrate (LOPRESSOR) 25 MG tablet Take 25 mg by mouth every morning.    06/03/2020 at Unknown time  . omeprazole (PRILOSEC) 20 MG capsule Take 20 mg by mouth every morning.    06/03/2020 at Unknown time  . oxybutynin (DITROPAN-XL) 10 MG 24 hr tablet Take 10 mg by mouth at bedtime.   06/03/2020 at Unknown time  . rosuvastatin (CRESTOR) 5 MG tablet Take 5 mg by mouth every morning.    06/03/2020 at Unknown time  . acetaminophen-codeine (TYLENOL #3) 300-30 MG tablet Take 1-2 tablets by mouth every 4 (four) hours as needed for moderate pain. 30 tablet 2   . ciprofloxacin (CIPRO) 500 MG tablet Take 1 tablet (500 mg total) by mouth 2 (two) times daily. 14 tablet 0   . docusate sodium (COLACE) 100 MG capsule Take 2 capsules (200 mg total) by mouth 2 (two) times daily. 120 capsule 3   . insulin aspart protamine- aspart (NOVOLOG MIX 70/30) (70-30) 100 UNIT/ML injection Inject 15-30 Units into the skin See admin instructions. Inject 30 units subcutaneously in the morning 15 units in the afternoon and 30 units in the evening   06/02/2020  . Meth-Hyo-M Bl-Na Phos-Ph Sal (URIBEL) 118 MG CAPS Take 1 capsule (118 mg total) by mouth every 6 (six) hours as needed. 40 capsule 3   . rivaroxaban (XARELTO) 20 MG TABS tablet Take 20 mg by mouth daily with supper. (Patient not taking: Reported on 06/04/2020)   Not Taking at Unknown time   Allergies  Allergen Reactions  . Lotensin [Benazepril]      Angioedema   Past Medical History:  Diagnosis Date  . BPH (benign prostatic hyperplasia)   . Diabetes mellitus without complication ([place])   . DVT (deep venous thrombosis) ([place]) 2018  . Erectile dysfunction   . GERD (gastroesophageal reflux disease)   . Glaucoma   . History of kidney stones    h/o  . Hyperlipidemia   . Hypertension   . Sleep apnea    does not use cpap   Past Surgical History:  Procedure Laterality Date  . COLONOSCOPY    . EYE SURGERY     Cataract extraction  . EYE SURGERY  02/20/2012   lens eye surgery  . EYE SURGERY  01/20/2014   lens eye surgery  . PENILE PROSTHESIS IMPLANT  2010  . PROSTATE BIOPSY N/A 07/17/2019   Procedure: PROSTATE BIOPSY URO NAV FUSION;  Surgeon: Royston Cowper, MD;  Location: [facility] ORS;  Service: Urology;  Laterality: N/A;  . PRP  2009, 2010  . TRANSURETHRAL RESECTION OF PROSTATE N/A 07/17/2019   Procedure: TRANSURETHRAL RESECTION OF THE PROSTATE (TURP);  Surgeon: Royston Cowper, MD;  Location: [facility] ORS;  Service: Urology;  Laterality: N/A;   Social History   Socioeconomic History  . Marital status: Married    Spouse name: Not on file  . Number  of children: Not on file  . Years of education: Not on file  . Highest education level: Not on file  Occupational History  . Not on file  Tobacco Use  . Smoking status: Former Smoker    Years: 1.00    Types: Cigarettes    Quit date: 02/10/1971    Years since quitting: 49.3  . Smokeless tobacco: Never Used  . Tobacco comment: only smoked when he drank socially when he was in the [employer]  . Vaping Use: Never used  Substance and Sexual Activity  . Alcohol use: Yes    Alcohol/week: 4.0 - 7.0 standard drinks    Types: 1 - 4 Glasses of wine, 3 Cans of beer per week    Comment: 2-3 times a week   . Drug use: Never  . Sexual activity: Not Currently  Other Topics Concern  . Not on file  Social History Narrative  . Not on file   Social Determinants of Health    Financial Resource Strain:   . Difficulty of Paying Living Expenses: Not on file  Food Insecurity:   . Worried About Charity fundraiser in the Last Year: Not on file  . Ran Out of Food in the Last Year: Not on file  Transportation Needs:   . Lack of Transportation (Medical): Not on file  . Lack of Transportation (Non-Medical): Not on file  Physical Activity:   . Days of Exercise per Week: Not on file  . Minutes of Exercise per Session: Not on file  Stress:   . Feeling of Stress : Not on file  Social Connections:   . Frequency of Communication with Friends and Family: Not on file  . Frequency of Social Gatherings with Friends and Family: Not on file  . Attends Religious Services: Not on file  . Active Member of Clubs or Organizations: Not on file  . Attends Archivist Meetings: Not on file  . Marital Status: Not on file  Intimate Partner Violence:   . Fear of Current or Ex-Partner: Not on file  . Emotionally Abused: Not on file  . Physically Abused: Not on file  . Sexually Abused: Not on file   Social History   Social History Narrative  . Not on file     ROS: Negative.     PE: HEENT: Negative. Lungs: Clear. Cardio: RR, HR 100.     Assessment/Plan:  Proceed with planned endoscopy.  Forest Gleason [facility] 06/04/2020

## 2020-06-04 NOTE — Op Note (Signed)
[facility] Gastroenterology Patient Name: Christopher Collier Procedure Date: 06/04/2020 8:57 AM MRN: [id number] Account #: [id number] Date of Birth: 01-Jan-1946 Admit Type: Outpatient Age: 74 Room: [facility] ENDO ROOM 1 Gender: Male Note Status: Finalized Procedure:             Colonoscopy Indications:           Chronic diarrhea, Iron deficiency anemia Providers:             Robert Bellow, MD Referring MD:          No Local Md, MD (Referring MD) Medicines:             Monitored Anesthesia Care Complications:         No immediate complications. Procedure:             Pre-Anesthesia Assessment:                        - Prior to the procedure, a History and Physical was                         performed, and patient medications, allergies and                         sensitivities were reviewed. The patient's tolerance                         of previous anesthesia was reviewed.                        - The risks and benefits of the procedure and the                         sedation options and risks were discussed with the                         patient. All questions were answered and informed                         consent was obtained.                        After obtaining informed consent, the colonoscope was                         passed under direct vision. Throughout the procedure,                         the patient's blood pressure, pulse, and oxygen                         saturations were monitored continuously. The                         Colonoscope was introduced through the anus with the                         intention of advancing to the cecum. The scope was  advanced to the transverse colon before the procedure                         was aborted. At this point, formed stool completely                         filled the lumen making safe passage impossible to                         assure. Medications were given. The  colonoscopy was                         performed without difficulty, except for the large                         stool volume. The patient tolerated the procedure                         well. The quality of the bowel preparation was poor,                         with formed stool preventing visualization of 80                         percent of the mucosa. Findings:      The entire examined colon ( able to be visulaized) appeared normal on       direct and retroflexion views. Stool showed no evidence of blood. Impression:            - The entire examined colon is normal on direct and                         retroflexion views.                        - No specimens collected. Recommendation:        - Repeat colonoscopy at appointment to be scheduled                         because the examination was incomplete.                        - Return to GI clinic in 1 week. Procedure Code(s):     --- Professional ---                        [phone], 62, Colonoscopy, flexible; diagnostic,                         including collection of specimen(s) by brushing or                         washing, when performed (separate procedure) Diagnosis Code(s):     --- Professional ---                        D50.9, Iron deficiency anemia, unspecified CPT copyright 2019 American Medical Association. All rights reserved. The codes documented in this report are preliminary and upon coder review may  be revised to meet current compliance requirements. Robert Bellow, MD 06/04/2020 9:48:41 AM This report has been signed electronically. Number of Addenda: 0 Note Initiated On: 06/04/2020 8:57 AM Total Procedure Duration: 0 hours 13 minutes 27 seconds  Estimated Blood Loss:  Estimated blood loss: none.      [facility]

## 2020-06-04 NOTE — Op Note (Signed)
[facility] Gastroenterology Patient Name: Christopher Collier Procedure Date: 06/04/2020 8:58 AM MRN: [id number] Account #: [id number] Date of Birth: July 23, 1946 Admit Type: Outpatient Age: 74 Room: [facility] ENDO ROOM 1 Gender: Male Note Status: Finalized Procedure:             Upper GI endoscopy Indications:           Iron deficiency anemia Providers:             Robert Bellow, MD Referring MD:          No Local Md, MD (Referring MD) Medicines:             Monitored Anesthesia Care Complications:         No immediate complications. Procedure:             Pre-Anesthesia Assessment:                        - Prior to the procedure, a History and Physical was                         performed, and patient medications, allergies and                         sensitivities were reviewed. The patient's tolerance                         of previous anesthesia was reviewed.                        - The risks and benefits of the procedure and the                         sedation options and risks were discussed with the                         patient. All questions were answered and informed                         consent was obtained.                        After obtaining informed consent, the endoscope was                         passed under direct vision. Throughout the procedure,                         the patient's blood pressure, pulse, and oxygen                         saturations were monitored continuously. The Endoscope                         was introduced through the mouth, and advanced to the                         second part of duodenum. The upper GI endoscopy was  accomplished without difficulty. The patient tolerated                         the procedure. Findings:      A widely patent Schatzki ring was found at the lower esophageal       sphincter.      The stomach was normal.      The examined duodenum was normal. Impression:             - Widely patent Schatzki ring.                        - Normal stomach.                        - Normal examined duodenum.                        - No specimens collected. Recommendation:        - Perform a colonoscopy today. Procedure Code(s):     --- Professional ---                        [phone], Esophagogastroduodenoscopy, flexible,                         transoral; diagnostic, including collection of                         specimen(s) by brushing or washing, when performed                         (separate procedure) Diagnosis Code(s):     --- Professional ---                        K22.2, Esophageal obstruction                        D50.9, Iron deficiency anemia, unspecified CPT copyright 2019 American Medical Association. All rights reserved. The codes documented in this report are preliminary and upon coder review may  be revised to meet current compliance requirements. Robert Bellow, MD 06/04/2020 9:26:54 AM This report has been signed electronically. Number of Addenda: 0 Note Initiated On: 06/04/2020 8:58 AM Estimated Blood Loss:  Estimated blood loss: none.      [facility]

## 2020-06-04 NOTE — Transfer of Care (Signed)
Immediate Anesthesia Transfer of Care Note  Patient: Christopher Collier  Procedure(s) Performed: COLONOSCOPY WITH PROPOFOL (N/A ) ESOPHAGOGASTRODUODENOSCOPY (EGD) (N/A )  Patient Location: PACU and Endoscopy Unit  Anesthesia Type:MAC  Level of Consciousness: awake  Airway & Oxygen Therapy: Patient connected to nasal cannula oxygen  Post-op Assessment: Report given to RN and Post -op Vital signs reviewed and stable  Post vital signs: Reviewed and stable  Last Vitals:  Vitals Value Taken Time  BP 103/67 06/04/20 0949  Temp    Pulse 83 06/04/20 0949  Resp 12 06/04/20 0949  SpO2 100 % 06/04/20 0949  Vitals shown include unvalidated device data.  Last Pain:  Vitals:   06/04/20 0949  TempSrc:   PainSc: 0-No pain         Complications: No complications documented.

## 2020-06-04 NOTE — Anesthesia Preprocedure Evaluation (Signed)
Anesthesia Evaluation  Patient identified by MRN, date of birth, ID band Patient awake    Reviewed: Allergy & Precautions, NPO status , Patient's Chart, lab work & pertinent test results  History of Anesthesia Complications Negative for: history of anesthetic complications  Airway Mallampati: II  TM Distance: >3 FB Neck ROM: Full    Dental  (+) Poor Dentition, Missing   Pulmonary sleep apnea , neg COPD, former smoker,    breath sounds clear to auscultation- rhonchi (-) wheezing      Cardiovascular hypertension, Pt. on medications (-) CAD, (-) Past MI, (-) Cardiac Stents and (-) CABG  Rhythm:Regular Rate:Normal - Systolic murmurs and - Diastolic murmurs    Neuro/Psych neg Seizures negative neurological ROS  negative psych ROS   GI/Hepatic Neg liver ROS, GERD  ,  Endo/Other  diabetes, Insulin Dependent  Renal/GU negative Renal ROS     Musculoskeletal negative musculoskeletal ROS (+)   Abdominal (+) - obese,   Peds  Hematology negative hematology ROS (+)   Anesthesia Other Findings Past Medical History: No date: BPH (benign prostatic hyperplasia) No date: Diabetes mellitus without complication ([place]) 4142: DVT (deep venous thrombosis) ([facility]) No date: Erectile dysfunction No date: GERD (gastroesophageal reflux disease) No date: Glaucoma No date: History of kidney stones     Comment:  h/o No date: Hyperlipidemia No date: Hypertension No date: Sleep apnea     Comment:  does not use cpap   Reproductive/Obstetrics                             Anesthesia Physical Anesthesia Plan  ASA: III  Anesthesia Plan: General   Post-op Pain Management:    Induction: Intravenous  PONV Risk Score and Plan: 1 and Propofol infusion  Airway Management Planned: Natural Airway  Additional Equipment:   Intra-op Plan:   Post-operative Plan:   Informed Consent: I have reviewed the patients History  and Physical, chart, labs and discussed the procedure including the risks, benefits and alternatives for the proposed anesthesia with the patient or authorized representative who has indicated his/her understanding and acceptance.     Dental advisory given  Plan Discussed with: CRNA and Anesthesiologist  Anesthesia Plan Comments:         Anesthesia Quick Evaluation

## 2020-06-04 NOTE — Anesthesia Postprocedure Evaluation (Signed)
Anesthesia Post Note  Patient: Christopher Collier  Procedure(s) Performed: COLONOSCOPY WITH PROPOFOL (N/A ) ESOPHAGOGASTRODUODENOSCOPY (EGD) (N/A )  Patient location during evaluation: Endoscopy Anesthesia Type: General Level of consciousness: awake and alert and oriented Pain management: pain level controlled Vital Signs Assessment: post-procedure vital signs reviewed and stable Respiratory status: spontaneous breathing, nonlabored ventilation and respiratory function stable Cardiovascular status: blood pressure returned to baseline and stable Postop Assessment: no signs of nausea or vomiting Anesthetic complications: no   No complications documented.   Last Vitals:  Vitals:   06/04/20 0949 06/04/20 0959  BP: 103/67 125/82  Pulse: 84 85  Resp: 19 17  Temp:    SpO2: 100% 100%    Last Pain:  Vitals:   06/04/20 1019  TempSrc:   PainSc: 0-No pain                 Skylen Spiering

## 2020-06-07 ENCOUNTER — Encounter: Payer: Self-pay | Admitting: General Surgery

## 2020-06-17 ENCOUNTER — Other Ambulatory Visit: Payer: Self-pay | Admitting: Urology

## 2020-06-17 DIAGNOSIS — R972 Elevated prostate specific antigen [PSA]: Secondary | ICD-10-CM

## 2020-06-23 ENCOUNTER — Other Ambulatory Visit: Payer: Self-pay | Admitting: General Surgery

## 2020-06-23 NOTE — Progress Notes (Signed)
Progress Notes by Mayer Masker, MD at 06/22/2020 1:45 PM  Author: Mayer Masker, MD Service: -- Author Type: Physician  Filed: 06/23/2020 6:57 PM Encounter Date: 06/22/2020 Note Type: Progress Notes  Editor: Mayer Masker, MD (Physician)  Expand AllCollapse All  []Expand All by Default   Subjective:     Patient ID: Christopher Collier is a 74 y.o. male.  HPI  The following portions of the patient's history were reviewed and updated as appropriate.  This an established patient is here today for: office visit. Here for follow up incomplete colonoscopy on 06-04-20.  The patient had a very poor prep, and questioning today appeared that he took perhaps 1 or 2 glasses of the MiraLAX, and none additional.  This was compatible with a large volume of stool noted.  The entire visit was spent reviewing the indications and proper method of preparation for the colonoscopy.  The procedure was planned due to the patient's anemia, while thought to be likely related to ongoing bleeding from his prostate cancer, a secondary source was being investigated.  Upper endoscopy completed on September 10 was essentially normal.  The patient's GI evaluation was almost 3 months ago, and there may have been some loss of communication with the patient in regards to proper prep.   Review of Systems  Constitutional: Negative for chills and fever.  Respiratory: Negative for cough.         Chief Complaint  Patient presents with  . Follow-up    incomplete colonoscopy     BP 112/62   Pulse 84   Temp 36.3 C (97.4 F)   Ht 167.6 cm (5' 6")   Wt 80.7 kg (178 lb)   SpO2 97%   BMI 28.73 kg/m       Past Medical History:  Diagnosis Date  . BPH (benign prostatic hyperplasia)   . Diabetes mellitus type 2, uncomplicated (CMS-HCC)   . Erectile dysfunction 03/06/2012  . GERD (gastroesophageal reflux disease)   . Glaucoma   . Hyperlipidemia   . Hypertension    . MRSA infection (methicillin-resistant Staphylococcus aureus) 05/25/2020   UTI          Past Surgical History:  Procedure Laterality Date  . CATARACT EXTRACTION     both eyes-2013,2015  . COLONOSCOPY  06/04/2020   Incomplete colon due to prep failure/Will need to be repeated/J[facility]  . EGD  06/04/2020   Normal EGD/No Repeat/J[facility]  . INSERTION PENILE PROSTHESIS    . LENS EYE SURGERY Left 02/20/2012   [place] no lensx   . LENS EYE SURGERY Right 01/20/2014   [place] no lensx   . PRP     PRP x 2 OD (2009) PRP x 2 OS (2010)       Social History          Socioeconomic History  . Marital status: Married    Spouse name: Barnetta Chapel  . Number of children: 4  . Years of education: Not on file  . Highest education level: Not on file  Occupational History  . 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 Smoking status: Former Smoker    Quit date: 02/10/1971    Years since quitting: 49.4  . Smokeless tobacco: Never Used  Vaping Use  . Vaping Use: Unknown  Substance and Sexual Activity  . Alcohol use: Yes    Alcohol/week: 4.0 - 7.0 standard drinks    Types: 1 - 4 Glasses of wine, 3 Cans of  beer per week    Comment: 2-3 times a week.  . Drug use: No    Comment: married; mail carrier   . Sexual activity: Not Currently    Partners: Female  Other Topics Concern  . Not on file  Social History Narrative   He has 4 children, 1 grandson.   He lives in [place], [place].    he does not exercise regularly.   Social Determinants of Health      Financial Resource Strain:   . Difficulty of Paying Living Expenses:   Food Insecurity:   . Worried About Charity fundraiser in the Last Year:   . Arboriculturist in the Last Year:   Transportation Needs:   . Film/video editor (Medical):   Marland Kitchen Lack of Transportation (Non-Medical):            Allergies  Allergen Reactions  . Lotensin [Benazepril] Angioedema    Current Medications         Current Outpatient Medications  Medication Sig Dispense Refill  . bicalutamide (CASODEX) 50 MG tablet Take 50 mg by mouth once daily    . blood sugar diagnostic, drum (BLOOD GLUCOSE DIAGNOSTIC, DRUM) test strip Use 2 (two) times daily Use as instructed. 200 each 3  . docusate (COLACE) 100 MG capsule Take 200 mg by mouth 2 (two) times daily    . ELIGARD, 6 MONTH, 45 mg injection Inject 45 mg subcutaneously every 6 (six) months    . flash glucose sensor (FREESTYLE LIBRE 14 DAY SENSOR) kit for glucose monitoring 6 kit 2  . glucose chew tablet 4 gram chewable tablet Take 4 tablets (16 g total) by mouth as needed for Low blood sugar 50 tablet 0  . insulin ASPART PROTAMINE-ASPART (NOVOLOG MIX 70-30 U-100 INSULN) 100 unit/mL (70-30) injection Take 23 units with breakfast and 23 units with dinner. 20 mL 11  . insulin syringe-needle U-100 0.3 mL 31 gauge x 15/64" Syrg Use 1 Syringe 4 (four) times daily 365 Syringe 0  . MELATONIN ORAL Take 1 tablet by mouth nightly    . metoprolol tartrate (LOPRESSOR) 25 MG tablet       . oxybutynin (DITROPAN-XL) 10 MG XL tablet Take 10 mg by mouth once daily       . rosuvastatin (CRESTOR) 5 MG tablet       . bisacodyL (DULCOLAX) 5 mg EC tablet Take two tablets morning and two tablets afternoon day prior to Miralax prep. 4 tablet 0  . flash glucose scanning reader (FREESTYLE LIBRE 14 DAY READER) Misc Use 1 Application once 1 each 0  . polyethylene glycol (MIRALAX) powder One bottle for colonoscopy prep. Use as directed. 255 g 0   No current facility-administered medications for this visit.           Family History  Problem Relation Age of Onset  . Diabetes type II Mother   . Diabetes type II Sister   . Stomach cancer Brother   . Diabetes type II Sister   . Diabetes type II Sister   . Diabetes type II Brother   . Prostate cancer Brother   . Coronary Artery Disease (Blocked arteries around heart) Neg Hx   . Kidney cancer Neg Hx         Objective:   Physical Exam Constitutional:      Appearance: Normal appearance.  Cardiovascular:     Rate and Rhythm: Normal rate and regular rhythm.  Pulmonary:     Breath sounds:  Normal breath sounds.  Musculoskeletal:     Cervical back: Normal range of motion and neck supple.  Skin:    General: Skin is warm and dry.  Neurological:     Mental Status: He is alert and oriented to person, place, and time.  Psychiatric:        Mood and Affect: Mood normal.        Behavior: Behavior normal.     Labs and Radiology:   May 26, 2020.   0 Result Notes   Ref Range & Units 4 wk ago   Hemoglobin 13.7 - 17.3 g/dL 9.7Low     Hematocrit 39.0 - 49.0 % 29.5Low          July 24, 2020:   WBC (White Blood Cell Count) 3.2 - 9.8 x10^9/L 7.5    Hemoglobin 13.7 - 17.3 g/dL 10.4Low     Hematocrit 39.0 - 49.0 % 32.3Low     Platelets 150 - 450 x10^9/L 348    MCV (Mean Corpuscular Volume) 80 - 98 fL 90    MCH (Mean Corpuscular Hemoglobin) 26.5 - 34.0 pg 29.1    MCHC (Mean Corpuscular Hemoglobin Concentration) 31.5 - 36.3 % 32.2    RBC (Red Blood Cell Count) 4.37 - 5.74 x10^12/L 3.58Low     RDW-CV (Red Cell Distribution Width) 11.5 - 14.5 % 13.2    NRBC (Nucleated Red Blood Cell Count) 0 x10^9/L 0.00    NRBC % (Nucleated Red Blood Cell %) % 0.0    MPV (Mean Platelet Volume) 7.2 - 11.7 fL 9.9    Neutrophil Count 2.0 - 8.6 x10^9/L 5.4    Neutrophil % 37 - 80 % 73.0    Lymphocyte Count 0.6 - 4.2 x10^9/L 1.4    Lymphocyte % 10 - 50 % 18.4    Monocyte Count 0 - 0.9 x10^9/L 0.5    Monocyte % 0 - 12 % 7.1    Eosinophil Count 0 - 0.70 x10^9/L 0.07    Eosinophil % 0 - 7 % 0.9    Basophil Count 0 - 0.20 x10^9/L 0.02    Basophil % 0 - 2 % 0.3    Immature Granulocyte Count <=0.06 x10^9/L 0.02    Immature Granulocyte % <=0.7 % 0.3   Resulting Agency  [facility]           Assessment:     Anemia likely secondary to ongoing blood loss from  prostate bleeding.    Plan:     The patient was carefully reinstructed in regards to preparation by the staff for his colonoscopy.  His blood sugar was 300 when he came in for his last procedure, and we will not change his normal insulin dose in light of this.    Entered by Karie Fetch, RN, acting as a scribe for Dr. Hervey Ard, MD.  The documentation recorded by the scribe accurately reflects the service I personally performed and the decisions made by me.   Robert Bellow, MD FACS        Office Visit on 06/22/2020      Office Visit on 06/22/2020

## 2020-07-07 ENCOUNTER — Other Ambulatory Visit: Payer: Self-pay

## 2020-07-07 ENCOUNTER — Other Ambulatory Visit
Admission: RE | Admit: 2020-07-07 | Discharge: 2020-07-07 | Disposition: A | Discharge status: Home / Self Care | Payer: 59 | Source: Ambulatory Visit | Attending: General Surgery | Admitting: General Surgery

## 2020-07-07 DIAGNOSIS — Z20822 Contact with and (suspected) exposure to covid-19: Secondary | ICD-10-CM | POA: Diagnosis not present

## 2020-07-07 DIAGNOSIS — Z01812 Encounter for preprocedural laboratory examination: Secondary | ICD-10-CM | POA: Diagnosis present

## 2020-07-08 ENCOUNTER — Encounter: Payer: Self-pay | Admitting: General Surgery

## 2020-07-08 LAB — SARS CORONAVIRUS 2 (TAT 6-24 HRS): SARS Coronavirus 2: NEGATIVE

## 2020-07-09 ENCOUNTER — Ambulatory Visit
Admission: RE | Admit: 2020-07-09 | Discharge: 2020-07-09 | Disposition: A | Discharge status: Home / Self Care | Payer: 59 | Attending: General Surgery | Admitting: General Surgery

## 2020-07-09 ENCOUNTER — Encounter: Payer: Self-pay | Admitting: General Surgery

## 2020-07-09 ENCOUNTER — Other Ambulatory Visit: Payer: Self-pay

## 2020-07-09 ENCOUNTER — Ambulatory Visit: Payer: 59 | Admitting: Anesthesiology

## 2020-07-09 ENCOUNTER — Encounter
Admission: RE | Disposition: A | Discharge status: Home / Self Care | Payer: Self-pay | Source: Home / Self Care | Attending: General Surgery

## 2020-07-09 DIAGNOSIS — N400 Enlarged prostate without lower urinary tract symptoms: Secondary | ICD-10-CM | POA: Diagnosis not present

## 2020-07-09 DIAGNOSIS — I10 Essential (primary) hypertension: Secondary | ICD-10-CM | POA: Insufficient documentation

## 2020-07-09 DIAGNOSIS — Z794 Long term (current) use of insulin: Secondary | ICD-10-CM | POA: Diagnosis not present

## 2020-07-09 DIAGNOSIS — K635 Polyp of colon: Secondary | ICD-10-CM | POA: Insufficient documentation

## 2020-07-09 DIAGNOSIS — D509 Iron deficiency anemia, unspecified: Secondary | ICD-10-CM | POA: Insufficient documentation

## 2020-07-09 DIAGNOSIS — G4730 Sleep apnea, unspecified: Secondary | ICD-10-CM | POA: Diagnosis not present

## 2020-07-09 DIAGNOSIS — Z87891 Personal history of nicotine dependence: Secondary | ICD-10-CM | POA: Diagnosis not present

## 2020-07-09 DIAGNOSIS — E785 Hyperlipidemia, unspecified: Secondary | ICD-10-CM | POA: Insufficient documentation

## 2020-07-09 DIAGNOSIS — Z888 Allergy status to other drugs, medicaments and biological substances status: Secondary | ICD-10-CM | POA: Insufficient documentation

## 2020-07-09 DIAGNOSIS — E119 Type 2 diabetes mellitus without complications: Secondary | ICD-10-CM | POA: Insufficient documentation

## 2020-07-09 DIAGNOSIS — D127 Benign neoplasm of rectosigmoid junction: Secondary | ICD-10-CM | POA: Diagnosis not present

## 2020-07-09 DIAGNOSIS — Z79899 Other long term (current) drug therapy: Secondary | ICD-10-CM | POA: Diagnosis not present

## 2020-07-09 HISTORY — PX: COLONOSCOPY WITH PROPOFOL: SHX5780

## 2020-07-09 HISTORY — DX: Urinary tract infection, site not specified: N39.0

## 2020-07-09 HISTORY — DX: Methicillin resistant Staphylococcus aureus infection, unspecified site: A49.02

## 2020-07-09 LAB — GLUCOSE, CAPILLARY: Glucose-Capillary: 236 mg/dL — ABNORMAL HIGH (ref 70–99)

## 2020-07-09 SURGERY — COLONOSCOPY WITH PROPOFOL
Anesthesia: General

## 2020-07-09 MED ORDER — SODIUM CHLORIDE 0.9 % IV SOLN: INTRAVENOUS | Status: DC

## 2020-07-09 MED ORDER — PROPOFOL 500 MG/50ML IV EMUL
INTRAVENOUS | Status: AC
  Filled 2020-07-09: qty 50

## 2020-07-09 MED ORDER — PHENYLEPHRINE HCL (PRESSORS) 10 MG/ML IV SOLN
INTRAVENOUS | Status: DC | PRN
  Administered 2020-07-09: 50 ug via INTRAVENOUS

## 2020-07-09 MED ORDER — PROPOFOL 500 MG/50ML IV EMUL
INTRAVENOUS | Status: DC | PRN
  Administered 2020-07-09: 125 ug/kg/min via INTRAVENOUS

## 2020-07-09 MED ORDER — LIDOCAINE HCL (PF) 2 % IJ SOLN
INTRAMUSCULAR | Status: AC
  Filled 2020-07-09: qty 5

## 2020-07-09 MED ORDER — LIDOCAINE HCL (CARDIAC) PF 100 MG/5ML IV SOSY
PREFILLED_SYRINGE | INTRAVENOUS | Status: DC | PRN
  Administered 2020-07-09: 60 mg via INTRAVENOUS

## 2020-07-09 MED ORDER — PROPOFOL 10 MG/ML IV BOLUS
INTRAVENOUS | Status: DC | PRN
  Administered 2020-07-09: 30 mg via INTRAVENOUS
  Administered 2020-07-09: 20 mg via INTRAVENOUS
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
## 2020-07-09 NOTE — Transfer of Care (Signed)
Immediate Anesthesia Transfer of Care Note  Patient: Christopher Collier  Procedure(s) Performed: COLONOSCOPY WITH PROPOFOL (N/A )  Patient Location: PACU and Endoscopy Unit  Anesthesia Type:General  Level of Consciousness: awake and drowsy  Airway & Oxygen Therapy: Patient Spontanous Breathing  Post-op Assessment: Report given to RN and Post -op Vital signs reviewed and stable  Post vital signs: Reviewed and stable  Last Vitals:  Vitals Value Taken Time  BP 100/51 07/09/20 0906  Temp    Pulse 87 07/09/20 0906  Resp 22 07/09/20 0906  SpO2 100 % 07/09/20 0906  Vitals shown include unvalidated device data.  Last Pain:  Vitals:   07/09/20 0806  TempSrc: Temporal  PainSc: 10-Worst pain ever         Complications: No complications documented.

## 2020-07-09 NOTE — Anesthesia Preprocedure Evaluation (Addendum)
Anesthesia Evaluation  Patient identified by MRN, date of birth, ID band Patient awake    Reviewed: Allergy & Precautions, NPO status , Patient's Chart, lab work & pertinent test results  History of Anesthesia Complications Negative for: history of anesthetic complications  Airway Mallampati: II       Dental   Pulmonary sleep apnea (not using CPAP) , neg COPD, Not current smoker, former smoker, PE          Cardiovascular hypertension, Pt. on medications (-) Past MI and (-) CHF (-) dysrhythmias (-) Valvular Problems/Murmurs     Neuro/Psych neg Seizures    GI/Hepatic Neg liver ROS, GERD  Medicated and Controlled,  Endo/Other  diabetes, Type 2, Oral Hypoglycemic Agents  Renal/GU negative Renal ROS     Musculoskeletal   Abdominal   Peds  Hematology   Anesthesia Other Findings   Reproductive/Obstetrics                            Anesthesia Physical Anesthesia Plan  ASA: III  Anesthesia Plan: General   Post-op Pain Management:    Induction: Intravenous  PONV Risk Score and Plan: 2 and Propofol infusion, TIVA and Treatment may vary due to age or medical condition  Airway Management Planned: Nasal Cannula  Additional Equipment:   Intra-op Plan:   Post-operative Plan:   Informed Consent: I have reviewed the patients History and Physical, chart, labs and discussed the procedure including the risks, benefits and alternatives for the proposed anesthesia with the patient or authorized representative who has indicated his/her understanding and acceptance.       Plan Discussed with:   Anesthesia Plan Comments:         Anesthesia Quick Evaluation

## 2020-07-09 NOTE — Op Note (Signed)
[facility] Patient Name: Christopher Collier Procedure Date: 07/09/2020 8:06 AM MRN: [id number] Account #: [id number] Date of Birth: [birth year] Admit Type: Outpatient Age: 74 [facility] END[facility]OM 1 Gender: Male Note Status: Finalized Procedure:             Colonoscopy Indications:           Iron deficiency anemia Providers:             Robert Bellow, MD Medicines:             Monitored Anesthesia Care Complications:         No immediate complications. Procedure:             Pre-Anesthesia Assessment:                        - Prior to the procedure, a History and Physical was                         performed, and patient medications, allergies and                         sensitivities were reviewed. The patient's tolerance                         of previous anesthesia was reviewed.                        - The risks and benefits of the procedure and the                         sedation options and risks were discussed with the                         patient. All questions were answered and informed                         consent was obtained.                        After obtaining informed consent, the colonoscope was                         passed under direct vision. Throughout the procedure,                         the patient's blood pressure, pulse, and oxygen                         saturations were monitored continuously. The                         Colonoscope was introduced through the anus and                         advanced to the the cecum, identified by appendiceal                         orifice and ileocecal valve. The colonoscopy was  somewhat difficult due to a tortuous colon. Successful                         completion of the procedure was aided by using manual                         pressure. The patient tolerated the procedure well.                         The quality of the bowel preparation was  adequate to                         identify polyps. Findings:      A 5 mm polyp was found in the transverse colon. The polyp was sessile.       The polyp was removed with a hot snare. Resection was complete, but the       polyp tissue was not retrieved.      A 5 mm polyp was found in the recto-sigmoid colon. The polyp was       sessile. The polyp was removed with a hot snare. Resection and retrieval       were complete.      A submucosal non-obstructing medium-sized mass was found in the rectum.       The mass was non-circumferential. The mass measured five cm in length.       In addition, its diameter measured thirty mm. No bleeding was present.      The retroflexed view of the distal rectum and anal verge was normal and       showed no anal or rectal abnormalities. Impression:            - One 5 mm polyp in the transverse colon, removed with                         a hot snare. Complete resection. Polyp tissue not                         retrieved.                        - One 5 mm polyp at the recto-sigmoid colon, removed                         with a hot snare. Resected and retrieved.                        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                        - The distal rectum and anal verge are normal on                         retroflexion view. Recommendation:        - Telephone endoscopist for pathology results in 1                         week. Procedure Code(s):     --- Professional ---  45385, Colonoscopy, flexible; with removal of                         tumor(s), polyp(s), or other lesion(s) by snare                         technique Diagnosis Code(s):     --- Professional ---                        K63.5, Polyp of colon                        D12.8, Benign neoplasm of rectum                        D50.9, Iron deficiency anemia, unspecified CPT copyright 2019 American Medical Association. All rights reserved. The codes documented in this report  are preliminary and upon coder review may  be revised to meet current compliance requirements. Robert Bellow, MD 07/09/2020 9:06:03 AM This report has been signed electronically. Number of Addenda: 0 Note Initiated On: 07/09/2020 8:06 AM Scope Withdrawal Time: 0 hours 16 minutes 28 seconds  Total Procedure Duration: 0 hours 31 minutes 21 seconds  Estimated Blood Loss:  Estimated blood loss: none.      [facility]

## 2020-07-09 NOTE — Anesthesia Procedure Notes (Signed)
Date/Time: 07/09/2020 8:21 AM Performed by: Jerrye Noble, CRNA Pre-anesthesia Checklist: Emergency Drugs available, Suction available, Patient identified and Patient being monitored Oxygen Delivery Method: Nasal cannula

## 2020-07-09 NOTE — Anesthesia Postprocedure Evaluation (Signed)
Anesthesia Post Note  Patient: Christopher Collier  Procedure(s) Performed: COLONOSCOPY WITH PROPOFOL (N/A )  Patient location during evaluation: Endoscopy Anesthesia Type: General Level of consciousness: awake and alert Pain management: pain level controlled Vital Signs Assessment: post-procedure vital signs reviewed and stable Respiratory status: spontaneous breathing and respiratory function stable Cardiovascular status: stable Anesthetic complications: no   No complications documented.   Last Vitals:  Vitals:   07/09/20 0926 07/09/20 0934  BP: 138/77 132/64  Pulse:    Resp:    Temp:    SpO2:      Last Pain:  Vitals:   07/09/20 0934  TempSrc:   PainSc: 0-No pain                 Kuulei Kleier K

## 2020-07-09 NOTE — OR Nursing (Signed)
Heart and Ling Assessment needed.  Dr. Terri Piedra aware.

## 2020-07-12 ENCOUNTER — Encounter: Payer: Self-pay | Admitting: General Surgery

## 2020-07-12 LAB — SURGICAL PATHOLOGY

## 2020-07-28 ENCOUNTER — Other Ambulatory Visit: Payer: Self-pay | Admitting: General Surgery

## 2020-07-28 ENCOUNTER — Other Ambulatory Visit: Payer: Self-pay

## 2020-07-28 ENCOUNTER — Other Ambulatory Visit: Payer: Self-pay | Admitting: Ophthalmology

## 2020-07-28 ENCOUNTER — Ambulatory Visit
Admission: RE | Admit: 2020-07-28 | Discharge: 2020-07-28 | Disposition: A | Discharge status: Home / Self Care | Payer: Self-pay | Source: Ambulatory Visit | Attending: General Surgery | Admitting: General Surgery

## 2020-07-28 DIAGNOSIS — N4281 Prostatodynia syndrome: Secondary | ICD-10-CM

## 2020-09-16 ENCOUNTER — Emergency Department: Payer: 59

## 2020-09-16 ENCOUNTER — Emergency Department
Admission: EM | Admit: 2020-09-16 | Discharge: 2020-09-17 | Disposition: A | Discharge status: Home / Self Care | Payer: 59 | Attending: Emergency Medicine | Admitting: Emergency Medicine

## 2020-09-16 ENCOUNTER — Other Ambulatory Visit: Payer: Self-pay

## 2020-09-16 DIAGNOSIS — E119 Type 2 diabetes mellitus without complications: Secondary | ICD-10-CM | POA: Diagnosis not present

## 2020-09-16 DIAGNOSIS — Z86718 Personal history of other venous thrombosis and embolism: Secondary | ICD-10-CM | POA: Diagnosis not present

## 2020-09-16 DIAGNOSIS — I2510 Atherosclerotic heart disease of native coronary artery without angina pectoris: Secondary | ICD-10-CM | POA: Diagnosis not present

## 2020-09-16 DIAGNOSIS — Z20822 Contact with and (suspected) exposure to covid-19: Secondary | ICD-10-CM | POA: Diagnosis not present

## 2020-09-16 DIAGNOSIS — N3001 Acute cystitis with hematuria: Secondary | ICD-10-CM | POA: Diagnosis not present

## 2020-09-16 DIAGNOSIS — C61 Malignant neoplasm of prostate: Secondary | ICD-10-CM | POA: Diagnosis not present

## 2020-09-16 DIAGNOSIS — N281 Cyst of kidney, acquired: Secondary | ICD-10-CM | POA: Insufficient documentation

## 2020-09-16 DIAGNOSIS — Z87891 Personal history of nicotine dependence: Secondary | ICD-10-CM | POA: Insufficient documentation

## 2020-09-16 DIAGNOSIS — C801 Malignant (primary) neoplasm, unspecified: Secondary | ICD-10-CM

## 2020-09-16 DIAGNOSIS — I7090 Unspecified atherosclerosis: Secondary | ICD-10-CM

## 2020-09-16 DIAGNOSIS — I10 Essential (primary) hypertension: Secondary | ICD-10-CM | POA: Diagnosis not present

## 2020-09-16 DIAGNOSIS — Z794 Long term (current) use of insulin: Secondary | ICD-10-CM | POA: Diagnosis not present

## 2020-09-16 DIAGNOSIS — R0602 Shortness of breath: Secondary | ICD-10-CM | POA: Diagnosis present

## 2020-09-16 DIAGNOSIS — D649 Anemia, unspecified: Secondary | ICD-10-CM | POA: Diagnosis not present

## 2020-09-16 LAB — CBC WITH DIFFERENTIAL/PLATELET
Abs Immature Granulocytes: 0.04 10*3/uL (ref 0.00–0.07)
Basophils Absolute: 0 10*3/uL (ref 0.0–0.1)
Basophils Relative: 0 %
Eosinophils Absolute: 0 10*3/uL (ref 0.0–0.5)
Eosinophils Relative: 0 %
HCT: 32.2 % — ABNORMAL LOW (ref 39.0–52.0)
Hemoglobin: 10.1 g/dL — ABNORMAL LOW (ref 13.0–17.0)
Immature Granulocytes: 0 %
Lymphocytes Relative: 6 %
Lymphs Abs: 0.6 10*3/uL — ABNORMAL LOW (ref 0.7–4.0)
MCH: 27.1 pg (ref 26.0–34.0)
MCHC: 31.4 g/dL (ref 30.0–36.0)
MCV: 86.3 fL (ref 80.0–100.0)
Monocytes Absolute: 0.4 10*3/uL (ref 0.1–1.0)
Monocytes Relative: 5 %
Neutro Abs: 8.1 10*3/uL — ABNORMAL HIGH (ref 1.7–7.7)
Neutrophils Relative %: 89 %
Platelets: 323 10*3/uL (ref 150–400)
RBC: 3.73 MIL/uL — ABNORMAL LOW (ref 4.22–5.81)
RDW: 14.4 % (ref 11.5–15.5)
WBC: 9.2 10*3/uL (ref 4.0–10.5)
nRBC: 0 % (ref 0.0–0.2)

## 2020-09-16 LAB — COMPREHENSIVE METABOLIC PANEL
ALT: 20 U/L (ref 0–44)
AST: 44 U/L — ABNORMAL HIGH (ref 15–41)
Albumin: 3.5 g/dL (ref 3.5–5.0)
Alkaline Phosphatase: 52 U/L (ref 38–126)
Anion gap: 10 (ref 5–15)
BUN: 13 mg/dL (ref 8–23)
CO2: 27 mmol/L (ref 22–32)
Calcium: 9.2 mg/dL (ref 8.9–10.3)
Chloride: 98 mmol/L (ref 98–111)
Creatinine, Ser: 0.79 mg/dL (ref 0.61–1.24)
GFR, Estimated: >60 mL/min (ref >60)
Glucose, Bld: 213 mg/dL — ABNORMAL HIGH (ref 70–99)
Potassium: 4.3 mmol/L (ref 3.5–5.1)
Sodium: 135 mmol/L (ref 135–145)
Total Bilirubin: 0.6 mg/dL (ref 0.3–1.2)
Total Protein: 7.8 g/dL (ref 6.5–8.1)

## 2020-09-16 LAB — TROPONIN I (HIGH SENSITIVITY): Troponin I (High Sensitivity): 12 ng/L (ref <18)

## 2020-09-16 LAB — BRAIN NATRIURETIC PEPTIDE: B Natriuretic Peptide: 52 pg/mL (ref 0.0–100.0)
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
## 2020-09-16 NOTE — ED Provider Notes (Signed)
[facility] Emergency Department Provider Note  ____________________________________________   Event Date/Time   First MD Initiated Contact with Patient 09/16/20 2302     (approximate)  I have reviewed the triage vital signs and the nursing notes.   HISTORY  Chief Complaint Weakness   HPI Christopher Collier is a 74 y.o. male with a past medical history of OSA, DVT, DM, BPH, GERD, HTN, HDL and prostate cancer currently on Bicalutamide who presents for assessment of seeing weekly chief complaints.  Patient notes he has had some shortness of breath at least over the last 2 weeks is been bothering him as well as some leakage around his Foley over the last couple weeks.  Such as Foley was recently exchanged and he chronically has some blood mixed with his urine in his Foley.  He also states of the last couple of days he has had some left-sided back pain.  He denies any recent falls or injuries.  He denies any fevers, headache, earache, sore throat, cough, chest pain, abdominal pain, rash or extremity pain.  No clear alleviating aggravating factors.  No prior similar episodes         Past Medical History:  Diagnosis Date  . BPH (benign prostatic hyperplasia)   . Diabetes mellitus without complication (HCC)   . DVT (deep venous thrombosis) (HCC) 2018  . Erectile dysfunction   . GERD (gastroesophageal reflux disease)   . Glaucoma   . History of kidney stones    h/o  . Hyperlipidemia   . Hypertension   . MRSA (methicillin resistant Staphylococcus aureus) infection   . Sleep apnea    does not use cpap  . UTI (urinary tract infection)     There are no problems to display for this patient.   Past Surgical History:  Procedure Laterality Date  . COLONOSCOPY    . COLONOSCOPY WITH PROPOFOL N/A 06/04/2020   Procedure: COLONOSCOPY WITH PROPOFOL;  Surgeon: Earline Mayotte, MD;  Location: [facility] ENDOSCOPY;  Service: Endoscopy;  Laterality: N/A;  . COLONOSCOPY WITH  PROPOFOL N/A 07/09/2020   Procedure: COLONOSCOPY WITH PROPOFOL;  Surgeon: Earline Mayotte, MD;  Location: [facility] ENDOSCOPY;  Service: Endoscopy;  Laterality: N/A;  . ESOPHAGOGASTRODUODENOSCOPY N/A 06/04/2020   Procedure: ESOPHAGOGASTRODUODENOSCOPY (EGD);  Surgeon: Earline Mayotte, MD;  Location: [facility] ENDOSCOPY;  Service: Endoscopy;  Laterality: N/A;  . EYE SURGERY     Cataract extraction  . EYE SURGERY  02/20/2012   lens eye surgery  . EYE SURGERY  01/20/2014   lens eye surgery  . lens surgery    . PENILE PROSTHESIS IMPLANT  2010  . PROSTATE BIOPSY N/A 07/17/2019   Procedure: PROSTATE BIOPSY URO NAV FUSION;  Surgeon: Orson Ape, MD;  Location: [facility] ORS;  Service: Urology;  Laterality: N/A;  . PRP  2009, 2010  . TRANSURETHRAL RESECTION OF PROSTATE N/A 07/17/2019   Procedure: TRANSURETHRAL RESECTION OF THE PROSTATE (TURP);  Surgeon: Orson Ape, MD;  Location: [facility] ORS;  Service: Urology;  Laterality: N/A;    Prior to Admission medications   Medication Sig Start Date End Date Taking? Authorizing Provider  acetaminophen-codeine (TYLENOL #3) 300-30 MG tablet Take 1-2 tablets by mouth every 4 (four) hours as needed for moderate pain. 07/17/19   Orson Ape, MD  bicalutamide (CASODEX) 50 MG tablet Take 50 mg by mouth daily.    [provider]  ciprofloxacin (CIPRO) 500 MG tablet Take 1 tablet (500 mg total) by mouth 2 (two) times  daily. 07/17/19   Royston Cowper, MD  docusate sodium (COLACE) 100 MG capsule Take 2 capsules (200 mg total) by mouth 2 (two) times daily. 07/17/19   Royston Cowper, MD  glucose 4 GM chewable tablet Chew 1 tablet by mouth as needed for low blood sugar.    [provider]  insulin aspart protamine- aspart (NOVOLOG MIX 70/30) (70-30) 100 UNIT/ML injection Inject 15-30 Units into the skin See admin instructions. Inject 30 units subcutaneously in the morning 15 units in the afternoon and 30 units in the evening    [provider]  leuprolide, 6 Month, (ELIGARD) 45 MG injection Inject 45 mg into the skin every 6 (six) months.    [provider]  MELATONIN PO Take by mouth at bedtime.    [provider]  Meth-Hyo-M Bl-Na Phos-Ph Sal (URIBEL) 118 MG CAPS Take 1 capsule (118 mg total) by mouth every 6 (six) hours as needed. 07/17/19   Royston Cowper, MD  metoprolol tartrate (LOPRESSOR) 25 MG tablet Take 25 mg by mouth every morning.     [provider]  omeprazole (PRILOSEC) 20 MG capsule Take 20 mg by mouth every morning.     [provider]  oxybutynin (DITROPAN-XL) 10 MG 24 hr tablet Take 10 mg by mouth at bedtime.    [provider]  rivaroxaban (XARELTO) 20 MG TABS tablet Take 20 mg by mouth daily with supper. Patient not taking: Reported on 06/04/2020    [provider]  rosuvastatin (CRESTOR) 5 MG tablet Take 5 mg by mouth every morning.     [provider]  sulfamethoxazole-trimethoprim (BACTRIM DS) 800-160 MG tablet Take 1 tablet by mouth 2 (two) times daily for 10 days. 09/17/20 09/27/20  Lucrezia Starch, MD    Allergies Lotensin [benazepril]  Family History  Problem Relation Age of Onset  . Diabetes Mother   . Diabetes Sister   . Stomach cancer Brother   . Diabetes Brother   . Prostate cancer Brother     Social History Social History   Tobacco Use  . Smoking status: Former Smoker    Years: 1.00    Types: Cigarettes    Quit date: 02/10/1971    Years since quitting: 49.6  . Smokeless tobacco: Never Used  . Tobacco comment: only smoked when he drank socially when he was in the [employer]  . Vaping Use: Never used  Substance Use Topics  . Alcohol use: Yes    Alcohol/week: 4.0 - 7.0 standard drinks    Types: 1 - 4 Glasses of wine, 3 Cans of beer per week    Comment: none this week  . Drug use: Never    Review of Systems  Review of Systems  Constitutional: Positive for malaise/fatigue. Negative for chills  and fever.  HENT: Negative for sore throat.   Eyes: Negative for pain.  Respiratory: Positive for shortness of breath. Negative for cough and stridor.   Cardiovascular: Negative for chest pain.  Gastrointestinal: Negative for vomiting.  Genitourinary: Positive for hematuria.  Musculoskeletal: Positive for back pain.  Skin: Negative for rash.  Neurological: Negative for seizures, loss of consciousness and headaches.  Psychiatric/Behavioral: Negative for suicidal ideas.  All other systems reviewed and are negative.     ____________________________________________   PHYSICAL EXAM:  VITAL SIGNS: ED Triage Vitals  Enc Vitals Group     BP 09/16/20 2240 134/86     Pulse Rate 09/16/20 2238 94  Resp 09/16/20 2238 14     Temp 09/16/20 2238 100.3 F (37.9 C)     Temp Source 09/16/20 2238 Oral     SpO2 09/16/20 2238 97 %     Weight 09/16/20 2240 170 lb (77.1 kg)     Height 09/16/20 2240 5\' 6"  (1.676 m)     Head Circumference --      Peak Flow --      Pain Score 09/16/20 2239 10     Pain Loc --      Pain Edu? --      Excl. in [place]? --    Vitals:   09/17/20 0014 09/17/20 0015  BP: (!) 141/82 (!) 141/82  Pulse: 95 97  Resp: 20 14  Temp:    SpO2: 96% 96%   Physical Exam Vitals and nursing note reviewed.  Constitutional:      Appearance: He is well-developed and well-nourished.  HENT:     Head: Normocephalic and atraumatic.     Right Ear: External ear normal.     Left Ear: External ear normal.     Nose: Nose normal.  Eyes:     Conjunctiva/sclera: Conjunctivae normal.  Cardiovascular:     Rate and Rhythm: Normal rate and regular rhythm.     Heart sounds: No murmur heard.   Pulmonary:     Effort: Pulmonary effort is normal. No respiratory distress.     Breath sounds: Normal breath sounds.  Abdominal:     Palpations: Abdomen is soft.     Tenderness: There is no abdominal tenderness.  Musculoskeletal:        General: No edema.     Cervical back: Neck supple.   Skin:    General: Skin is warm and dry.     Capillary Refill: Capillary refill takes less than 2 seconds.  Neurological:     Mental Status: He is alert and oriented to person, place, and time.  Psychiatric:        Mood and Affect: Mood and affect and mood normal.     No tenderness over the CT or L-spine.  There are some mild tenderness over the left paralumbar muscles.  No overlying skin changes.  Foley is in place without active drainage and there is some blood-tinged urine in the bag.  Scrotum is unremarkable. ____________________________________________   LABS (all labs ordered are listed, but only abnormal results are displayed)  Labs Reviewed  CBC WITH DIFFERENTIAL/PLATELET - Abnormal; Notable for the following components:      Result Value   RBC 3.73 (*)    Hemoglobin 10.1 (*)    HCT 32.2 (*)    Neutro Abs 8.1 (*)    Lymphs Abs 0.6 (*)    All other components within normal limits  COMPREHENSIVE METABOLIC PANEL - Abnormal; Notable for the following components:   Glucose, Bld 213 (*)    AST 44 (*)    All other components within normal limits  URINALYSIS, COMPLETE (UACMP) WITH MICROSCOPIC - Abnormal; Notable for the following components:   Color, Urine AMBER (*)    APPearance TURBID (*)    Specific Gravity, Urine 1.031 (*)    Hgb urine dipstick LARGE (*)    Protein, ur >=300 (*)    Leukocytes,Ua LARGE (*)    RBC / HPF >50 (*)    WBC, UA >50 (*)    All other components within normal limits  RESP PANEL BY RT-PCR (FLU A&B, COVID) [id number]  URINE CULTURE  BRAIN NATRIURETIC PEPTIDE  PROCALCITONIN  TROPONIN I (HIGH SENSITIVITY)  TROPONIN I (HIGH SENSITIVITY)   ____________________________________________  EKG  Sinus rhythm with ventricular of 97, left axis deviation, prolonged QTc interval at 502 and some isolated nonspecific changes in anterior leads. ____________________________________________  RADIOLOGY  ED MD interpretation: Chest x-ray shows no focal  consolidations, large effusion, pneumothorax or overt edema. No other acute intra-abdominal process. CT abdomen pelvis remarkable for some evidence of cystitis without any perinephric stranding or other acute intra-abdominal process.  Official radiology report(s): DG Chest 2 View  Result Date: 09/16/2020 CLINICAL DATA:  Weakness, history of prostate cancer EXAM: CHEST - 2 VIEW COMPARISON:  None. FINDINGS: Frontal and lateral views of the chest demonstrate an unremarkable cardiac silhouette. No airspace disease, effusion, or pneumothorax. No acute bony abnormalities. IMPRESSION: 1. No acute intrathoracic process. Electronically Signed   By: Randa Ngo M.D.   On: 09/16/2020 23:25   CT ABDOMEN PELVIS W CONTRAST  Result Date: 09/17/2020 CLINICAL DATA:  Soaked in urine. EXAM: CT ABDOMEN AND PELVIS WITH CONTRAST TECHNIQUE: Multidetector CT imaging of the abdomen and pelvis was performed using the standard protocol following bolus administration of intravenous contrast. CONTRAST:  148mL OMNIPAQUE IOHEXOL 300 MG/ML  SOLN COMPARISON:  Pelvic MRI, dated June 06, 2019. FINDINGS: Lower chest: No acute abnormality. Hepatobiliary: No focal liver abnormality is seen. No gallstones, gallbladder wall thickening, or biliary dilatation. Pancreas: Unremarkable. No pancreatic ductal dilatation or surrounding inflammatory changes. Spleen: Normal in size without focal abnormality. Adrenals/Urinary Tract: Adrenal glands are unremarkable. Kidneys are normal in size, without renal calculi or hydronephrosis. 2.7 cm and 1.3 cm simple cysts are seen within the right kidney. 2.4 cm, 1.8 cm and 0.8 cm cysts are seen within the left kidney. A Foley catheter is seen within an empty urinary bladder. Marked severity urinary bladder wall thickening is noted with a mild amount of surrounding inflammatory fat stranding. Stomach/Bowel: Stomach is within normal limits. Appendix appears normal. No evidence of bowel wall thickening,  distention, or inflammatory changes. Vascular/Lymphatic: No significant vascular findings are present. An inferior vena cava filter is in place. No enlarged abdominal or pelvic lymph nodes. Reproductive: A 6.2 cm diameter cystic appearing area is seen posterior to a mildly enlarged heterogeneous prostate gland. This is seen on the prior pelvis MRI. Mass effect is seen on the adjacent portion of the distal sigmoid colon. A penile pump and associated reservoir are present. Other: No abdominal wall hernia or abnormality. No abdominopelvic ascites. Musculoskeletal: Marked severity degenerative changes seen at the level of L5-S1. IMPRESSION: 1. Findings consistent with cystitis. Follow-up to resolution is recommended to exclude the presence of an underlying neoplastic process. 2. Bilateral simple appearing renal cysts. 3. Large, stable posterior pelvic cyst which is seen on the prior pelvic MRI dated June 06, 2019 and may originate from the prostate gland. 4. Inferior vena cava filter. 5. Marked severity degenerative changes at the level of L5-S1. 6. Aortic atherosclerosis. Aortic Atherosclerosis (ICD10-I70.0). Electronically Signed   By: Virgina Norfolk M.D.   On: 09/17/2020 01:03    ____________________________________________   PROCEDURES  Procedure(s) performed (including Critical Care):  .1-3 Lead EKG Interpretation Performed by: Lucrezia Starch, MD Authorized by: Lucrezia Starch, MD     Interpretation: normal     ECG rate assessment: normal     Rhythm: sinus rhythm     Ectopy: none     Conduction: normal       ____________________________________________   INITIAL IMPRESSION / ASSESSMENT AND PLAN / ED COURSE  Patient presents with [place] to history exam for assessment of several complaints including some shortness of breath over the last couple day weeks as well as left lower back pain, general malaise and weakness as well as some leakage around his Foley that is been ongoing  for several weeks.  On arrival he is a slightly elevated temperature of 100.3 and is otherwise stable on room air.  With guarded patient shortness of breath differential includes ACS, acute symptomatic anemia, arrhythmia, pneumonia, pneumothorax, heart failure, symptomatic effusion, and PE.  With regard to left lower back pain differential includes kidney stone, metastatic prostate cancer lesion, pyelonephritis, diverticulitis, SBO, splenic infarct and musculoskeletal pain.  UA is consistent with cystitis. Urine culture sent.  CT is concerning for cystitis.  There are some renal cyst noted and a stable pelvic cyst as well as IVC filter.  No other acute intra-abdominal or pelvic process.  There is severe degenerative changes of the L5-S1 disc basis and some aortic atherosclerosis.  I suspect patient's symptoms and pain are likely related to cystitis.  No evidence of SBO, splenic infarct, diverticulitis, or other clear acute intra-abdominal or pelvic process.  He does not have tenderness specifically over right CVA area or elevated white blood cell count to suggest pyelonephritis.  No different no significant perinephric stranding seen on CT.  Suspect patient's elevated temperature is related to his cystitis.  Do not believe patient is septic at this time.  Very low suspicion for ACS contributing to presentation given nonelevated troponin and patient denying any chest pain with reassuring EKG.  BNP is also not elevated chest x-ray shows no evidence of volume overload, pneumonia, or other acute intrathoracic process.  Very low suspicion for PE given patient is not hypoxic, tachycardic, tachypneic and does not have any lower extremity asymmetric edema and is currently anticoagulated.  Unclear etiology at the time although he believes he is safe for discharge with plan for close outpatient follow-up. Advised patient to have his hemoglobin rechecked in the next 2 to 3 days and follow-up with his PCP. Patient  given 1 dose of Rocephin in the ED and Rx written for Bactrim. Discharge stable condition. Strict return precautions advised and discussed.      ____________________________________________   FINAL CLINICAL IMPRESSION(S) / ED DIAGNOSES  Final diagnoses:  Acute cystitis with hematuria  Anemia, unspecified type  Atherosclerosis  Cancer ([place])    Medications  cefTRIAXone (ROCEPHIN) 1 g in sodium chloride 0.9 % 100 mL IVPB (has no administration in time range)  iohexol (OMNIPAQUE) 300 MG/ML solution 100 mL (100 mLs Intravenous Contrast Given 09/17/20 0023)  oxyCODONE-acetaminophen (PERCOCET/ROXICET) 5-325 MG per tablet 1 tablet (1 tablet Oral Given 09/17/20 0043)  acetaminophen (TYLENOL) tablet 500 mg (500 mg Oral Given 09/17/20 0043)     ED Discharge Orders         Ordered    sulfamethoxazole-trimethoprim (BACTRIM DS) 800-160 MG tablet  2 times daily        09/17/20 0217           Note:  This document was prepared using Dragon voice recognition software and may include unintentional dictation errors.   Lucrezia Starch, MD 09/17/20 [phone]

## 2020-09-17 ENCOUNTER — Emergency Department: Payer: 59

## 2020-09-17 ENCOUNTER — Encounter: Payer: Self-pay | Admitting: Radiology

## 2020-09-17 LAB — URINALYSIS, COMPLETE (UACMP) WITH MICROSCOPIC
Bacteria, UA: NONE SEEN
Bilirubin Urine: NEGATIVE
Glucose, UA: NEGATIVE mg/dL
Ketones, ur: NEGATIVE mg/dL
Nitrite: NEGATIVE
Protein, ur: >=300 mg/dL — AB
RBC / HPF: >50 RBC/hpf — ABNORMAL HIGH (ref 0–5)
Specific Gravity, Urine: 1.031 — ABNORMAL HIGH (ref 1.005–1.030)
Squamous Epithelial / HPF: NONE SEEN (ref 0–5)
WBC, UA: >50 WBC/hpf — ABNORMAL HIGH (ref 0–5)
pH: 8 (ref 5.0–8.0)

## 2020-09-17 LAB — RESP PANEL BY RT-PCR (FLU A&B, COVID) ARPGX2
Influenza A by PCR: NEGATIVE
Influenza B by PCR: NEGATIVE
SARS Coronavirus 2 by RT PCR: NEGATIVE

## 2020-09-17 LAB — PROCALCITONIN: Procalcitonin: 1.14 ng/mL

## 2020-09-17 MED ORDER — SULFAMETHOXAZOLE-TRIMETHOPRIM 800-160 MG PO TABS: 1.0000 | ORAL_TABLET | Freq: Two times a day (BID) | ORAL | 0 refills | Status: AC

## 2020-09-17 MED ORDER — OXYCODONE-ACETAMINOPHEN 5-325 MG PO TABS
1.0000 | ORAL_TABLET | Freq: Once | ORAL | Status: AC
  Administered 2020-09-17: 1 via ORAL
  Filled 2020-09-17: qty 1

## 2020-09-17 MED ORDER — SODIUM CHLORIDE 0.9 % IV SOLN
1.0000 g | Freq: Once | INTRAVENOUS | Status: AC
  Administered 2020-09-17: 1 g via INTRAVENOUS
  Filled 2020-09-17: qty 10

## 2020-09-17 MED ORDER — ACETAMINOPHEN 500 MG PO TABS
500.0000 mg | ORAL_TABLET | Freq: Once | ORAL | Status: AC
  Administered 2020-09-17: 500 mg via ORAL
  Filled 2020-09-17: qty 1

## 2020-09-17 MED ORDER — IOHEXOL 300 MG/ML  SOLN
100.0000 mL | Freq: Once | INTRAMUSCULAR | Status: AC | PRN
  Administered 2020-09-17: 100 mL via INTRAVENOUS

## 2020-09-17 NOTE — ED Notes (Signed)
No e-signature obtained due to no e-signature pad being available at this time.  PT verbalized understanding of all discharge instructions, medications, and f/u care.

## 2020-09-18 LAB — URINE CULTURE

## 2021-07-20 ENCOUNTER — Inpatient Hospital Stay: Payer: Medicare Other

## 2021-07-20 ENCOUNTER — Emergency Department: Payer: Medicare Other

## 2021-07-20 ENCOUNTER — Other Ambulatory Visit: Payer: Self-pay

## 2021-07-20 ENCOUNTER — Inpatient Hospital Stay
Admission: EM | Admit: 2021-07-20 | Discharge: 2021-08-02 | DRG: 564 | Disposition: A | Discharge status: Skilled Nursing Facility | Payer: Medicare Other | Attending: Internal Medicine | Admitting: Internal Medicine

## 2021-07-20 ENCOUNTER — Encounter: Payer: Self-pay | Admitting: Emergency Medicine

## 2021-07-20 DIAGNOSIS — H409 Unspecified glaucoma: Secondary | ICD-10-CM | POA: Diagnosis present

## 2021-07-20 DIAGNOSIS — L89313 Pressure ulcer of right buttock, stage 3: Secondary | ICD-10-CM | POA: Diagnosis present

## 2021-07-20 DIAGNOSIS — L89303 Pressure ulcer of unspecified buttock, stage 3: Secondary | ICD-10-CM | POA: Diagnosis not present

## 2021-07-20 DIAGNOSIS — S42031D Displaced fracture of lateral end of right clavicle, subsequent encounter for fracture with routine healing: Secondary | ICD-10-CM | POA: Diagnosis not present

## 2021-07-20 DIAGNOSIS — Z87891 Personal history of nicotine dependence: Secondary | ICD-10-CM

## 2021-07-20 DIAGNOSIS — Z8546 Personal history of malignant neoplasm of prostate: Secondary | ICD-10-CM

## 2021-07-20 DIAGNOSIS — Z20822 Contact with and (suspected) exposure to covid-19: Secondary | ICD-10-CM | POA: Diagnosis present

## 2021-07-20 DIAGNOSIS — Z794 Long term (current) use of insulin: Secondary | ICD-10-CM | POA: Diagnosis not present

## 2021-07-20 DIAGNOSIS — C799 Secondary malignant neoplasm of unspecified site: Secondary | ICD-10-CM | POA: Diagnosis present

## 2021-07-20 DIAGNOSIS — L03317 Cellulitis of buttock: Secondary | ICD-10-CM | POA: Diagnosis present

## 2021-07-20 DIAGNOSIS — Y92009 Unspecified place in unspecified non-institutional (private) residence as the place of occurrence of the external cause: Secondary | ICD-10-CM | POA: Diagnosis not present

## 2021-07-20 DIAGNOSIS — R531 Weakness: Secondary | ICD-10-CM

## 2021-07-20 DIAGNOSIS — W19XXXD Unspecified fall, subsequent encounter: Secondary | ICD-10-CM | POA: Diagnosis present

## 2021-07-20 DIAGNOSIS — G4733 Obstructive sleep apnea (adult) (pediatric): Secondary | ICD-10-CM | POA: Diagnosis present

## 2021-07-20 DIAGNOSIS — K219 Gastro-esophageal reflux disease without esophagitis: Secondary | ICD-10-CM | POA: Diagnosis present

## 2021-07-20 DIAGNOSIS — E1169 Type 2 diabetes mellitus with other specified complication: Secondary | ICD-10-CM | POA: Diagnosis not present

## 2021-07-20 DIAGNOSIS — G8929 Other chronic pain: Secondary | ICD-10-CM | POA: Diagnosis present

## 2021-07-20 DIAGNOSIS — Z751 Person awaiting admission to adequate facility elsewhere: Secondary | ICD-10-CM

## 2021-07-20 DIAGNOSIS — Z7901 Long term (current) use of anticoagulants: Secondary | ICD-10-CM

## 2021-07-20 DIAGNOSIS — W19XXXA Unspecified fall, initial encounter: Secondary | ICD-10-CM | POA: Diagnosis not present

## 2021-07-20 DIAGNOSIS — I10 Essential (primary) hypertension: Secondary | ICD-10-CM | POA: Diagnosis present

## 2021-07-20 DIAGNOSIS — C7800 Secondary malignant neoplasm of unspecified lung: Secondary | ICD-10-CM | POA: Diagnosis present

## 2021-07-20 DIAGNOSIS — E876 Hypokalemia: Secondary | ICD-10-CM | POA: Diagnosis present

## 2021-07-20 DIAGNOSIS — E785 Hyperlipidemia, unspecified: Secondary | ICD-10-CM | POA: Diagnosis present

## 2021-07-20 DIAGNOSIS — Z6841 Body Mass Index (BMI) 40.0 and over, adult: Secondary | ICD-10-CM

## 2021-07-20 DIAGNOSIS — E119 Type 2 diabetes mellitus without complications: Secondary | ICD-10-CM

## 2021-07-20 DIAGNOSIS — N401 Enlarged prostate with lower urinary tract symptoms: Secondary | ICD-10-CM | POA: Diagnosis present

## 2021-07-20 DIAGNOSIS — N39498 Other specified urinary incontinence: Secondary | ICD-10-CM | POA: Diagnosis present

## 2021-07-20 DIAGNOSIS — Z833 Family history of diabetes mellitus: Secondary | ICD-10-CM

## 2021-07-20 DIAGNOSIS — D75839 Thrombocytosis, unspecified: Secondary | ICD-10-CM | POA: Diagnosis not present

## 2021-07-20 DIAGNOSIS — I2510 Atherosclerotic heart disease of native coronary artery without angina pectoris: Secondary | ICD-10-CM | POA: Diagnosis present

## 2021-07-20 DIAGNOSIS — D509 Iron deficiency anemia, unspecified: Secondary | ICD-10-CM | POA: Diagnosis present

## 2021-07-20 DIAGNOSIS — T796XXA Traumatic ischemia of muscle, initial encounter: Secondary | ICD-10-CM | POA: Diagnosis not present

## 2021-07-20 DIAGNOSIS — Z86718 Personal history of other venous thrombosis and embolism: Secondary | ICD-10-CM

## 2021-07-20 DIAGNOSIS — E669 Obesity, unspecified: Secondary | ICD-10-CM | POA: Diagnosis present

## 2021-07-20 DIAGNOSIS — Z79899 Other long term (current) drug therapy: Secondary | ICD-10-CM

## 2021-07-20 DIAGNOSIS — M6282 Rhabdomyolysis: Secondary | ICD-10-CM | POA: Diagnosis present

## 2021-07-20 DIAGNOSIS — Z8042 Family history of malignant neoplasm of prostate: Secondary | ICD-10-CM

## 2021-07-20 DIAGNOSIS — Z800 Family history of malignant neoplasm of digestive organs: Secondary | ICD-10-CM

## 2021-07-20 DIAGNOSIS — Z87440 Personal history of urinary (tract) infections: Secondary | ICD-10-CM

## 2021-07-20 DIAGNOSIS — E1165 Type 2 diabetes mellitus with hyperglycemia: Secondary | ICD-10-CM | POA: Diagnosis present

## 2021-07-20 DIAGNOSIS — R509 Fever, unspecified: Secondary | ICD-10-CM | POA: Diagnosis not present

## 2021-07-20 DIAGNOSIS — L89323 Pressure ulcer of left buttock, stage 3: Secondary | ICD-10-CM | POA: Diagnosis present

## 2021-07-20 DIAGNOSIS — Z8614 Personal history of Methicillin resistant Staphylococcus aureus infection: Secondary | ICD-10-CM

## 2021-07-20 LAB — CBC WITH DIFFERENTIAL/PLATELET
Abs Immature Granulocytes: 0.03 10*3/uL (ref 0.00–0.07)
Basophils Absolute: 0 10*3/uL (ref 0.0–0.1)
Basophils Relative: 0 %
Eosinophils Absolute: 0 10*3/uL (ref 0.0–0.5)
Eosinophils Relative: 0 %
HCT: 34.8 % — ABNORMAL LOW (ref 39.0–52.0)
Hemoglobin: 11.5 g/dL — ABNORMAL LOW (ref 13.0–17.0)
Immature Granulocytes: 0 %
Lymphocytes Relative: 8 %
Lymphs Abs: 0.8 10*3/uL (ref 0.7–4.0)
MCH: 28.5 pg (ref 26.0–34.0)
MCHC: 33 g/dL (ref 30.0–36.0)
MCV: 86.4 fL (ref 80.0–100.0)
Monocytes Absolute: 0.7 10*3/uL (ref 0.1–1.0)
Monocytes Relative: 6 %
Neutro Abs: 8.9 10*3/uL — ABNORMAL HIGH (ref 1.7–7.7)
Neutrophils Relative %: 86 %
Platelets: 283 10*3/uL (ref 150–400)
RBC: 4.03 MIL/uL — ABNORMAL LOW (ref 4.22–5.81)
RDW: 15.2 % (ref 11.5–15.5)
WBC: 10.5 10*3/uL (ref 4.0–10.5)
nRBC: 0 % (ref 0.0–0.2)

## 2021-07-20 LAB — RESP PANEL BY RT-PCR (FLU A&B, COVID) ARPGX2
Influenza A by PCR: NEGATIVE
Influenza B by PCR: NEGATIVE
SARS Coronavirus 2 by RT PCR: NEGATIVE

## 2021-07-20 LAB — URINALYSIS, ROUTINE W REFLEX MICROSCOPIC
Bacteria, UA: NONE SEEN
Bilirubin Urine: NEGATIVE
Glucose, UA: >=500 mg/dL — AB
Ketones, ur: 20 mg/dL — AB
Leukocytes,Ua: NEGATIVE
Nitrite: NEGATIVE
Protein, ur: 100 mg/dL — AB
Specific Gravity, Urine: 1.011 (ref 1.005–1.030)
Squamous Epithelial / HPF: NONE SEEN (ref 0–5)
pH: 6 (ref 5.0–8.0)

## 2021-07-20 LAB — MAGNESIUM: Magnesium: 1.5 mg/dL — ABNORMAL LOW (ref 1.7–2.4)

## 2021-07-20 LAB — COMPREHENSIVE METABOLIC PANEL
ALT: 17 U/L (ref 0–44)
AST: 41 U/L (ref 15–41)
Albumin: 2.7 g/dL — ABNORMAL LOW (ref 3.5–5.0)
Alkaline Phosphatase: 128 U/L — ABNORMAL HIGH (ref 38–126)
Anion gap: 12 (ref 5–15)
BUN: 12 mg/dL (ref 8–23)
CO2: 30 mmol/L (ref 22–32)
Calcium: 8.1 mg/dL — ABNORMAL LOW (ref 8.9–10.3)
Chloride: 96 mmol/L — ABNORMAL LOW (ref 98–111)
Creatinine, Ser: 0.82 mg/dL (ref 0.61–1.24)
GFR, Estimated: >60 mL/min (ref >60)
Glucose, Bld: 272 mg/dL — ABNORMAL HIGH (ref 70–99)
Potassium: 2.7 mmol/L — CL (ref 3.5–5.1)
Sodium: 138 mmol/L (ref 135–145)
Total Bilirubin: 1.6 mg/dL — ABNORMAL HIGH (ref 0.3–1.2)
Total Protein: 6.8 g/dL (ref 6.5–8.1)

## 2021-07-20 LAB — TROPONIN I (HIGH SENSITIVITY)
Troponin I (High Sensitivity): 18 ng/L — ABNORMAL HIGH (ref <18)
Troponin I (High Sensitivity): 17 ng/L (ref <18)

## 2021-07-20 LAB — LACTIC ACID, PLASMA
Lactic Acid, Venous: 1.4 mmol/L (ref 0.5–1.9)
Lactic Acid, Venous: 1.4 mmol/L (ref 0.5–1.9)

## 2021-07-20 LAB — CK: Total CK: 942 U/L — ABNORMAL HIGH (ref 49–397)

## 2021-07-20 MED ORDER — MAGNESIUM SULFATE 2 GM/50ML IV SOLN
2.0000 g | Freq: Once | INTRAVENOUS | Status: AC
  Administered 2021-07-20: 2 g via INTRAVENOUS
  Filled 2021-07-20: qty 50

## 2021-07-20 MED ORDER — BICALUTAMIDE 50 MG PO TABS
50.0000 mg | ORAL_TABLET | Freq: Every day | ORAL | Status: DC
  Administered 2021-07-21 – 2021-08-02 (×13): 50 mg via ORAL
  Filled 2021-07-20 (×15): qty 1

## 2021-07-20 MED ORDER — SODIUM CHLORIDE 0.9 % IV SOLN: INTRAVENOUS | Status: DC

## 2021-07-20 MED ORDER — ONDANSETRON HCL 4 MG/2ML IJ SOLN: 4.0000 mg | Freq: Four times a day (QID) | INTRAMUSCULAR | Status: DC | PRN

## 2021-07-20 MED ORDER — SODIUM CHLORIDE 0.9 % IV SOLN: 2.0000 g | INTRAVENOUS | Status: DC

## 2021-07-20 MED ORDER — ONDANSETRON HCL 4 MG PO TABS: 4.0000 mg | ORAL_TABLET | Freq: Four times a day (QID) | ORAL | Status: DC | PRN

## 2021-07-20 MED ORDER — METOPROLOL TARTRATE 25 MG PO TABS
25.0000 mg | ORAL_TABLET | Freq: Every day | ORAL | Status: DC
  Administered 2021-07-21 – 2021-08-02 (×13): 25 mg via ORAL
  Filled 2021-07-20 (×13): qty 1

## 2021-07-20 MED ORDER — SODIUM CHLORIDE 0.9 % IV BOLUS
1000.0000 mL | Freq: Once | INTRAVENOUS | Status: AC
  Administered 2021-07-20: 1000 mL via INTRAVENOUS

## 2021-07-20 MED ORDER — POTASSIUM CHLORIDE 10 MEQ/100ML IV SOLN
10.0000 meq | INTRAVENOUS | Status: AC
  Administered 2021-07-20 (×3): 10 meq via INTRAVENOUS
  Filled 2021-07-20: qty 100

## 2021-07-20 MED ORDER — ACETAMINOPHEN 650 MG RE SUPP: 650.0000 mg | Freq: Four times a day (QID) | RECTAL | Status: DC | PRN

## 2021-07-20 MED ORDER — POTASSIUM CHLORIDE CRYS ER 20 MEQ PO TBCR
40.0000 meq | EXTENDED_RELEASE_TABLET | Freq: Two times a day (BID) | ORAL | Status: AC
  Administered 2021-07-20 (×2): 40 meq via ORAL
  Filled 2021-07-20 (×2): qty 2

## 2021-07-20 MED ORDER — OXYBUTYNIN CHLORIDE ER 10 MG PO TB24
10.0000 mg | ORAL_TABLET | Freq: Every day | ORAL | Status: DC
  Administered 2021-07-20 – 2021-08-01 (×13): 10 mg via ORAL
  Filled 2021-07-20 (×16): qty 1

## 2021-07-20 MED ORDER — VANCOMYCIN HCL 2000 MG/400ML IV SOLN
2000.0000 mg | Freq: Once | INTRAVENOUS | Status: AC
  Administered 2021-07-20: 2000 mg via INTRAVENOUS
  Filled 2021-07-20: qty 400

## 2021-07-20 MED ORDER — POTASSIUM CHLORIDE CRYS ER 20 MEQ PO TBCR
40.0000 meq | EXTENDED_RELEASE_TABLET | Freq: Once | ORAL | Status: AC
  Administered 2021-07-20: 40 meq via ORAL
  Filled 2021-07-20: qty 2

## 2021-07-20 MED ORDER — ENOXAPARIN SODIUM 40 MG/0.4ML IJ SOSY
40.0000 mg | PREFILLED_SYRINGE | INTRAMUSCULAR | Status: DC
  Administered 2021-07-20: 40 mg via SUBCUTANEOUS
  Filled 2021-07-20: qty 0.4

## 2021-07-20 MED ORDER — ROSUVASTATIN CALCIUM 5 MG PO TABS
5.0000 mg | ORAL_TABLET | Freq: Every day | ORAL | Status: DC
  Administered 2021-07-21: 5 mg via ORAL
  Filled 2021-07-20: qty 1

## 2021-07-20 MED ORDER — ACETAMINOPHEN 325 MG PO TABS: 650.0000 mg | ORAL_TABLET | Freq: Four times a day (QID) | ORAL | Status: DC | PRN

## 2021-07-20 MED ORDER — SODIUM CHLORIDE 0.9 % IV SOLN
2.0000 g | Freq: Once | INTRAVENOUS | Status: AC
  Administered 2021-07-20: 2 g via INTRAVENOUS
  Filled 2021-07-20: qty 20

## 2021-07-20 MED ORDER — PANTOPRAZOLE SODIUM 40 MG PO TBEC
40.0000 mg | DELAYED_RELEASE_TABLET | Freq: Every day | ORAL | Status: DC
  Administered 2021-07-20 – 2021-08-02 (×14): 40 mg via ORAL
  Filled 2021-07-20 (×14): qty 1

## 2021-07-20 NOTE — ED Notes (Signed)
Patient's brief wet and had BM. Patient cleaned and placed on male purewick. Wife at bedside.

## 2021-07-20 NOTE — ED Provider Notes (Signed)
[facility] Emergency Department Provider Note  ____________________________________________   Event Date/Time   First MD Initiated Contact with Patient 07/20/21 1354     (approximate)  I have reviewed the triage vital signs and the nursing notes.   HISTORY  Chief Complaint Weakness    HPI Christopher Collier is a 75 y.o. male with past medical history of hypertension, hyperlipidemia, recurrent UTIs, metastatic prostate cancer, here with generalized weakness.  Patient reportedly says that he was feeling somewhat tired on Monday and laid down on the floor.  He essentially has been unable to get up since then.  He states he just did not want to get up because he felt weak and the floor was "comfortable."  He states that he has not eaten or drank essentially anything.  He states he was very weak and could not really sit up on his own today.  He does not believe he fell.  He has no new pain.  He does, however, endorse some fairly chronic pain that has mildly worsened in his buttocks area, which she states is due to him being incontinent of urine at baseline with urine in his diapers constantly.  He states he has had some mild increase in this pain which is worse whenever he moves.  No other complaints.  No numbness or weakness.    Past Medical History:  Diagnosis Date   BPH (benign prostatic hyperplasia)    Diabetes mellitus without complication (HCC)    DVT (deep venous thrombosis) ([place]) 2018   Erectile dysfunction    GERD (gastroesophageal reflux disease)    Glaucoma    History of kidney stones    h/o   Hyperlipidemia    Hypertension    MRSA (methicillin resistant Staphylococcus aureus) infection    Sleep apnea    does not use cpap   UTI (urinary tract infection)     Patient Active Problem List   Diagnosis Date Noted   Generalized weakness 07/20/2021    Past Surgical History:  Procedure Laterality Date   COLONOSCOPY     COLONOSCOPY WITH PROPOFOL  N/A 06/04/2020   Procedure: COLONOSCOPY WITH PROPOFOL;  Surgeon: Robert Bellow, MD;  Location: [facility] ENDOSCOPY;  Service: Endoscopy;  Laterality: N/A;   COLONOSCOPY WITH PROPOFOL N/A 07/09/2020   Procedure: COLONOSCOPY WITH PROPOFOL;  Surgeon: Robert Bellow, MD;  Location: [facility] ENDOSCOPY;  Service: Endoscopy;  Laterality: N/A;   ESOPHAGOGASTRODUODENOSCOPY N/A 06/04/2020   Procedure: ESOPHAGOGASTRODUODENOSCOPY (EGD);  Surgeon: Robert Bellow, MD;  Location: [facility] ENDOSCOPY;  Service: Endoscopy;  Laterality: N/A;   EYE SURGERY     Cataract extraction   EYE SURGERY  02/20/2012   lens eye surgery   EYE SURGERY  01/20/2014   lens eye surgery   lens surgery     PENILE PROSTHESIS IMPLANT  2010   PROSTATE BIOPSY N/A 07/17/2019   Procedure: PROSTATE BIOPSY URO NAV FUSION;  Surgeon: Royston Cowper, MD;  Location: [facility] ORS;  Service: Urology;  Laterality: N/A;   PRP  2009, 2010   TRANSURETHRAL RESECTION OF PROSTATE N/A 07/17/2019   Procedure: TRANSURETHRAL RESECTION OF THE PROSTATE (TURP);  Surgeon: Royston Cowper, MD;  Location: [facility] ORS;  Service: Urology;  Laterality: N/A;    Prior to Admission medications   Medication Sig Start Date End Date Taking? Authorizing Provider  acetaminophen-codeine (TYLENOL #3) 300-30 MG tablet Take 1-2 tablets by mouth every 4 (four) hours as needed for moderate pain. 07/17/19   Royston Cowper,  MD  bicalutamide (CASODEX) 50 MG tablet Take 50 mg by mouth daily.    [provider]  ciprofloxacin (CIPRO) 500 MG tablet Take 1 tablet (500 mg total) by mouth 2 (two) times daily. 07/17/19   Royston Cowper, MD  docusate sodium (COLACE) 100 MG capsule Take 2 capsules (200 mg total) by mouth 2 (two) times daily. 07/17/19   Royston Cowper, MD  glucose 4 GM chewable tablet Chew 1 tablet by mouth as needed for low blood sugar.    [provider]  insulin aspart protamine- aspart (NOVOLOG MIX 70/30) (70-30) 100 UNIT/ML injection Inject 15-30  Units into the skin See admin instructions. Inject 30 units subcutaneously in the morning 15 units in the afternoon and 30 units in the evening    [provider]  leuprolide, 6 Month, (ELIGARD) 45 MG injection Inject 45 mg into the skin every 6 (six) months.    [provider]  MELATONIN PO Take by mouth at bedtime.    [provider]  Meth-Hyo-M Bl-Na Phos-Ph Sal (URIBEL) 118 MG CAPS Take 1 capsule (118 mg total) by mouth every 6 (six) hours as needed. 07/17/19   Royston Cowper, MD  metoprolol tartrate (LOPRESSOR) 25 MG tablet Take 25 mg by mouth every morning.     [provider]  omeprazole (PRILOSEC) 20 MG capsule Take 20 mg by mouth every morning.     [provider]  oxybutynin (DITROPAN-XL) 10 MG 24 hr tablet Take 10 mg by mouth at bedtime.    [provider]  rivaroxaban (XARELTO) 20 MG TABS tablet Take 20 mg by mouth daily with supper. Patient not taking: Reported on 06/04/2020    [provider]  rosuvastatin (CRESTOR) 5 MG tablet Take 5 mg by mouth every morning.     [provider]    Allergies Lotensin [benazepril]  Family History  Problem Relation Age of Onset   Diabetes Mother    Diabetes Sister    Stomach cancer Brother    Diabetes Brother    Prostate cancer Brother     Social History Social History   Tobacco Use   Smoking status: Former    Years: 1.00    Types: Cigarettes    Quit date: 02/10/1971    Years since quitting: 50.4   Smokeless tobacco: Never   Tobacco comments:    only smoked when he drank socially when he was in the [employer] Use   Vaping Use: Never used  Substance Use Topics   Alcohol use: Yes    Alcohol/week: 4.0 - 7.0 standard drinks    Types: 1 - 4 Glasses of wine, 3 Cans of beer per week    Comment: none this week   Drug use: Never    Review of Systems  Review of Systems  Constitutional:  Positive for fatigue. Negative for chills and fever.  HENT:   Negative for sore throat.   Respiratory:  Negative for shortness of breath.   Cardiovascular:  Negative for chest pain.  Gastrointestinal:  Negative for abdominal pain.  Genitourinary:  Negative for flank pain.  Musculoskeletal:  Negative for neck pain.  Skin:  Positive for rash. Negative for wound.  Allergic/Immunologic: Negative for immunocompromised state.  Neurological:  Positive for weakness. Negative for numbness.  Hematological:  Does not bruise/bleed easily.  All other systems reviewed and are negative.   ____________________________________________  PHYSICAL EXAM:      VITAL SIGNS: ED Triage Vitals  Enc  Vitals Group     BP 07/20/21 1411 127/74     Pulse Rate 07/20/21 1411 92     Resp 07/20/21 1411 18     Temp 07/20/21 1411 98.9 F (37.2 C)     Temp Source 07/20/21 1411 Oral     SpO2 07/20/21 1411 95 %     Weight 07/20/21 1412 190 lb (86.2 kg)     Height 07/20/21 1412 5\' 7"  (1.702 m)     Head Circumference --      Peak Flow --      Pain Score 07/20/21 1412 0     Pain Loc --      Pain Edu? --      Excl. in [place]? --      Physical Exam Vitals and nursing note reviewed.  Constitutional:      General: He is not in acute distress.    Appearance: He is well-developed.  HENT:     Head: Normocephalic and atraumatic.     Mouth/Throat:     Mouth: Mucous membranes are dry.  Eyes:     Conjunctiva/sclera: Conjunctivae normal.  Cardiovascular:     Rate and Rhythm: Normal rate and regular rhythm.     Heart sounds: Normal heart sounds. No murmur heard.   No friction rub.  Pulmonary:     Effort: Pulmonary effort is normal. No respiratory distress.     Breath sounds: Normal breath sounds. No wheezing or rales.  Abdominal:     General: There is no distension.     Palpations: Abdomen is soft.     Tenderness: There is no abdominal tenderness.  Musculoskeletal:     Cervical back: Neck supple.  Skin:    General: Skin is warm.     Capillary Refill: Capillary refill takes  less than 2 seconds.     Comments: Stage III ulcers to medial buttocks with surrounding erythema, induration, warmth.   Neurological:     Mental Status: He is alert and oriented to person, place, and time.     Motor: No abnormal muscle tone.      ____________________________________________   LABS (all labs ordered are listed, but only abnormal results are displayed)  Labs Reviewed  CBC WITH DIFFERENTIAL/PLATELET - Abnormal; Notable for the following components:      Result Value   RBC 4.03 (*)    Hemoglobin 11.5 (*)    HCT 34.8 (*)    Neutro Abs 8.9 (*)    All other components within normal limits  COMPREHENSIVE METABOLIC PANEL - Abnormal; Notable for the following components:   Potassium 2.7 (*)    Chloride 96 (*)    Glucose, Bld 272 (*)    Calcium 8.1 (*)    Albumin 2.7 (*)    Alkaline Phosphatase 128 (*)    Total Bilirubin 1.6 (*)    All other components within normal limits  CK - Abnormal; Notable for the following components:   Total CK 942 (*)    All other components within normal limits  TROPONIN I (HIGH SENSITIVITY) - Abnormal; Notable for the following components:   Troponin I (High Sensitivity) 18 (*)    All other components within normal limits  RESP PANEL BY RT-PCR (FLU A&B, COVID) ARPGX2  CULTURE, BLOOD (ROUTINE X 2)  CULTURE, BLOOD (ROUTINE X 2)  URINALYSIS, ROUTINE W REFLEX MICROSCOPIC  LACTIC ACID, PLASMA  LACTIC ACID, PLASMA  MAGNESIUM  TROPONIN I (HIGH SENSITIVITY)    ____________________________________________  EKG:  ________________________________________  RADIOLOGY  All imaging, including plain films, CT scans, and ultrasounds, independently reviewed by me, and interpretations confirmed via formal radiology reads.  ED MD interpretation:   CT Head: NAICA CXR: No acute abnormality  Official radiology report(s): CT HEAD WO CONTRAST (5MM)  Result Date: 07/20/2021 CLINICAL DATA:  Weakness, delirium EXAM: CT HEAD WITHOUT CONTRAST  TECHNIQUE: Contiguous axial images were obtained from the base of the skull through the vertex without intravenous contrast. COMPARISON:  None. FINDINGS: Brain: There is no acute intracranial hemorrhage, extra-axial fluid collection, or acute infarct. There is mild global parenchymal volume loss with commensurate enlargement of the ventricular system. Hypodensity in the subcortical and periventricular white matter likely reflects sequela of mild chronic white matter microangiopathy. There is no mass lesion.  There is no midline shift. Vascular: No hyperdense vessel or unexpected calcification. Skull: Normal. Negative for fracture or focal lesion. Sinuses/Orbits: The imaged paranasal sinuses are clear. Bilateral lens implants are in place. The globes and orbits are otherwise unremarkable. Other: None. IMPRESSION: 1. No acute intracranial pathology. 2. Mild parenchymal volume loss and chronic white matter microangiopathy. Electronically Signed   By: Valetta Mole M.D.   On: 07/20/2021 15:13   DG Chest Portable 1 View  Result Date: 07/20/2021 CLINICAL DATA:  Weakness EXAM: PORTABLE CHEST 1 VIEW COMPARISON:  Chest radiograph 09/16/2020 FINDINGS: The cardiomediastinal silhouette is within normal limits. There is no focal consolidation or pulmonary edema. There is no pleural effusion or pneumothorax. There is no acute osseous abnormality. IMPRESSION: No radiographic evidence of acute cardiopulmonary process. Electronically Signed   By: Valetta Mole M.D.   On: 07/20/2021 15:11    ____________________________________________  PROCEDURES   Procedure(s) performed (including Critical Care):  Procedures  ____________________________________________  INITIAL IMPRESSION / MDM / [place] / ED COURSE  As part of my medical decision making, I reviewed the following data within the [place] notes reviewed and incorporated, Old chart reviewed, Notes from prior ED visits, and [place]  Controlled Substance Database       *Christopher Collier was evaluated in Emergency Department on 07/20/2021 for the symptoms described in the history of present illness. He was evaluated in the context of the global COVID-19 pandemic, which necessitated consideration that the patient might be at risk for infection with the SARS-CoV-2 virus that causes COVID-19. Institutional protocols and algorithms that pertain to the evaluation of patients at risk for COVID-19 are in a state of rapid change based on information released by regulatory bodies including the CDC and federal and state organizations. These policies and algorithms were followed during the patient's care in the ED.  Some ED evaluations and interventions may be delayed as a result of limited staffing during the pandemic.*     Medical Decision Making:  75 yo M with PMHx as above here with generalized weakness after lying on ground x 48 hours. Unclear why pt initially layed down. Suspect weakness 2/2 cellulitis of buttocks area and/or possible UTI, with h/o same. Exam shows moderate cellulitis, open wounds from pressure sores. Will cover empirically. Temp 99.8 rectally. Labs show no leukocytosis, hypokalemia, hyperglycemia, mild rhabdo and slight trop elevation, likely demand related. COVID is negative. CT head reviewed and is negative. CXR is clear.  Will cover empirically for cellulitis, possible UTI, and hydrate for rhabdo and hypokalemia. Admit to medicine.  ____________________________________________  FINAL CLINICAL IMPRESSION(S) / ED DIAGNOSES  Final diagnoses:  Cellulitis of buttock  Hypokalemia  Generalized weakness     MEDICATIONS GIVEN DURING  THIS VISIT:  Medications  sodium chloride 0.9 % bolus 1,000 mL (has no administration in time range)  potassium chloride SA (KLOR-CON) CR tablet 40 mEq (has no administration in time range)  potassium chloride 10 mEq in 100 mL IVPB (has no administration in time range)  cefTRIAXone  (ROCEPHIN) 2 g in sodium chloride 0.9 % 100 mL IVPB (has no administration in time range)  vancomycin (VANCOREADY) IVPB 2000 mg/400 mL (has no administration in time range)  potassium chloride SA (KLOR-CON) CR tablet 40 mEq (has no administration in time range)  0.9 %  sodium chloride infusion (has no administration in time range)     ED Discharge Orders     None        Note:  This document was prepared using Dragon voice recognition software and may include unintentional dictation errors.   Duffy Bruce, MD 07/20/21 1550

## 2021-07-20 NOTE — ED Notes (Signed)
Pt incontinent of stool and urine. Bed linens and changed, new purewick placed. Pt resting well, denies any needs, call bell in reach

## 2021-07-20 NOTE — ED Notes (Signed)
Patient to CT at this time

## 2021-07-20 NOTE — Progress Notes (Signed)
PHARMACY -  BRIEF ANTIBIOTIC NOTE   Pharmacy has received consult(s) for vancomycin from an ED provider.  The patient's profile has been reviewed for ht/wt/allergies/indication/available labs.    One time order(s) placed for vancomycin 2 g  Further antibiotics/pharmacy consults should be ordered by admitting physician if indicated.                       Thank you,  Tawnya Crook, PharmD, BCPS Clinical Pharmacist 07/20/2021 3:34 PM

## 2021-07-20 NOTE — ED Triage Notes (Signed)
Patient to ED via EMS. EMS did not give report to RN. Patient states he has been on floor since Monday but states he did not fall. Patient seems confused but can answer appropriately to self and time. Patient denies any pain.

## 2021-07-20 NOTE — H&P (Signed)
History and Physical    Christopher Collier JSH:[id number] DOB: 24-Jun-1946 DOA: 07/20/2021  PCP: Orpah Greek, DO   Patient coming from: Home  I have personally briefly reviewed patient's old medical records in [place]  Chief Complaint: Weakness  History was obtained from both patient and his wife at the bedside. HPI: Christopher Collier is a 75 y.o. male with medical history significant for prostate cancer history of hypertension, diabetes mellitus, GERD, sleep apnea, history of DVT who was brought into the ER by EMS. Patient states that he laid down on the ground 3 days prior to his presentation and did that for comfort.  He denies having a fall.  He remained on the ground because he was unable to get up.  His wife who was at the bedside said she helped clean him up the best she could while he remained on the floor.  Patient states that he had no had anything to eat in 3 days but has been drinking liquids.  He denies feeling dizzy or lightheaded and denies having any chest pain, no shortness of breath, no abdominal pain, no changes in his bowel habits, no headache, no blurred vision, no focal deficits, no cough, no fever, no chills, no shortness of breath. Labs show sodium 138, potassium 2.7, chloride 96, bicarb 30, glucose 272, BUN 12, creatinine 0.82, calcium 8.1, alkaline phosphatase 128, albumin 2.7, AST 41, ALT 17, total protein 6.8, total CK9 42, troponin 18, white count 10.5, hemoglobin 11.5, hematocrit 34.8, MCV 86.4, RDW 15.2, platelet count 283 Respiratory viral panel is negative Chest x-ray reviewed by me shows no evidence of acute cardiopulmonary disease CT scan of the head without contrast shows no acute intracranial pathology.  Mild parenchymal volume loss and chronic white matter microangiopathy.  ED Course: Patient is a 75 year old male who presents to the emergency room via EMS for evaluation of generalized weakness. Patient is noted to have stage III ulcers to  the medial buttocks with surrounding erythema, induration and differential warmth. Labs show hypokalemia He will be admitted to the hospital for further evaluation.  Review of Systems: As per HPI otherwise all other systems reviewed and negative.    Past Medical History:  Diagnosis Date   BPH (benign prostatic hyperplasia)    Diabetes mellitus without complication (HCC)    DVT (deep venous thrombosis) ([place]) 2018   Erectile dysfunction    GERD (gastroesophageal reflux disease)    Glaucoma    History of kidney stones    h/o   Hyperlipidemia    Hypertension    MRSA (methicillin resistant Staphylococcus aureus) infection    Sleep apnea    does not use cpap   UTI (urinary tract infection)     Past Surgical History:  Procedure Laterality Date   COLONOSCOPY     COLONOSCOPY WITH PROPOFOL N/A 06/04/2020   Procedure: COLONOSCOPY WITH PROPOFOL;  Surgeon: Robert Bellow, MD;  Location: [facility];  Service: Endoscopy;  Laterality: N/A;   COLONOSCOPY WITH PROPOFOL N/A 07/09/2020   Procedure: COLONOSCOPY WITH PROPOFOL;  Surgeon: Robert Bellow, MD;  Location: [facility] ENDOSCOPY;  Service: Endoscopy;  Laterality: N/A;   ESOPHAGOGASTRODUODENOSCOPY N/A 06/04/2020   Procedure: ESOPHAGOGASTRODUODENOSCOPY (EGD);  Surgeon: Robert Bellow, MD;  Location: [facility] ENDOSCOPY;  Service: Endoscopy;  Laterality: N/A;   EYE SURGERY     Cataract extraction   EYE SURGERY  02/20/2012   lens eye surgery   EYE SURGERY  01/20/2014   lens eye surgery  lens surgery     PENILE PROSTHESIS IMPLANT  2010   PROSTATE BIOPSY N/A 07/17/2019   Procedure: PROSTATE BIOPSY URO NAV FUSION;  Surgeon: Royston Cowper, MD;  Location: [facility] ORS;  Service: Urology;  Laterality: N/A;   PRP  2009, 2010   TRANSURETHRAL RESECTION OF PROSTATE N/A 07/17/2019   Procedure: TRANSURETHRAL RESECTION OF THE PROSTATE (TURP);  Surgeon: Royston Cowper, MD;  Location: [facility] ORS;  Service: Urology;  Laterality: N/A;     reports  that he quit smoking about 50 years ago. His smoking use included cigarettes. He has never used smokeless tobacco. He reports current alcohol use of about 4.0 - 7.0 standard drinks per week. He reports that he does not use drugs.  Allergies  Allergen Reactions   Lotensin [Benazepril]     Angioedema    Family History  Problem Relation Age of Onset   Diabetes Mother    Diabetes Sister    Stomach cancer Brother    Diabetes Brother    Prostate cancer Brother       Prior to Admission medications   Medication Sig Start Date End Date Taking? Authorizing Provider  acetaminophen-codeine (TYLENOL #3) 300-30 MG tablet Take 1-2 tablets by mouth every 4 (four) hours as needed for moderate pain. 07/17/19   Royston Cowper, MD  bicalutamide (CASODEX) 50 MG tablet Take 50 mg by mouth daily.    [provider]  ciprofloxacin (CIPRO) 500 MG tablet Take 1 tablet (500 mg total) by mouth 2 (two) times daily. 07/17/19   Royston Cowper, MD  docusate sodium (COLACE) 100 MG capsule Take 2 capsules (200 mg total) by mouth 2 (two) times daily. 07/17/19   Royston Cowper, MD  glucose 4 GM chewable tablet Chew 1 tablet by mouth as needed for low blood sugar.    [provider]  insulin aspart protamine- aspart (NOVOLOG MIX 70/30) (70-30) 100 UNIT/ML injection Inject 15-30 Units into the skin See admin instructions. Inject 30 units subcutaneously in the morning 15 units in the afternoon and 30 units in the evening    [provider]  leuprolide, 6 Month, (ELIGARD) 45 MG injection Inject 45 mg into the skin every 6 (six) months.    [provider]  MELATONIN PO Take by mouth at bedtime.    [provider]  Meth-Hyo-M Bl-Na Phos-Ph Sal (URIBEL) 118 MG CAPS Take 1 capsule (118 mg total) by mouth every 6 (six) hours as needed. 07/17/19   Royston Cowper, MD  metoprolol tartrate (LOPRESSOR) 25 MG tablet Take 25 mg by mouth every morning.     [provider]   omeprazole (PRILOSEC) 20 MG capsule Take 20 mg by mouth every morning.     [provider]  oxybutynin (DITROPAN-XL) 10 MG 24 hr tablet Take 10 mg by mouth at bedtime.    [provider]  rivaroxaban (XARELTO) 20 MG TABS tablet Take 20 mg by mouth daily with supper. Patient not taking: Reported on 06/04/2020    [provider]  rosuvastatin (CRESTOR) 5 MG tablet Take 5 mg by mouth every morning.     [provider]    Physical Exam: Vitals:   07/20/21 1411 07/20/21 1412 07/20/21 1531  BP: 127/74    Pulse: 92    Resp: 18    Temp: 98.9 F (37.2 C)  99.8 F (37.7 C)  TempSrc: Oral  Rectal  SpO2: 95%    Weight:  86.2 kg   Height:  5\' 7"  (1.702 m)      Vitals:   07/20/21 1411 07/20/21 1412 07/20/21 1531  BP: 127/74    Pulse: 92    Resp: 18    Temp: 98.9 F (37.2 C)  99.8 F (37.7 C)  TempSrc: Oral  Rectal  SpO2: 95%    Weight:  86.2 kg   Height:  5\' 7"  (1.702 m)       Constitutional: Alert and oriented x 2 (to person and  place). Not in any apparent distress HEENT:      Head: Normocephalic and atraumatic.         Eyes: PERLA, EOMI, Conjunctivae pallor. Sclera is non-icteric.       Mouth/Throat: Mucous membranes are dry.       Neck: Supple with no signs of meningismus. Cardiovascular: Regular rate and rhythm. No murmurs, gallops, or rubs. 2+ symmetrical distal pulses are present . No JVD. No LE edema Respiratory: Respiratory effort normal .Lungs sounds clear bilaterally. No wheezes, crackles, or rhonchi.  Gastrointestinal: Soft, non tender, and non distended with positive bowel sounds.  Genitourinary: No CVA tenderness. Musculoskeletal: Nontender with normal range of motion in all extremities. No cyanosis, or erythema of extremities. Neurologic:  Face is symmetric. Moving all extremities. No gross focal neurologic deficits . Skin: Skin is warm, dry.  Stage III ulcers to medial buttocks with surrounding erythema, induration, warmth.    Psychiatric: Mood and affect are normal    Labs on Admission: I have personally reviewed following labs and imaging studies  CBC: Recent Labs  Lab 07/20/21 1416  WBC 10.5  NEUTROABS 8.9*  HGB 11.5*  HCT 34.8*  MCV 86.4  PLT 619   Basic Metabolic Panel: Recent Labs  Lab 07/20/21 1416  NA 138  K 2.7*  CL 96*  CO2 30  GLUCOSE 272*  BUN 12  CREATININE 0.82  CALCIUM 8.1*   GFR: Estimated Creatinine Clearance: 82.8 mL/min (by C-G formula based on SCr of 0.82 mg/dL). Liver Function Tests: Recent Labs  Lab 07/20/21 1416  AST 41  ALT 17  ALKPHOS 128*  BILITOT 1.6*  PROT 6.8  ALBUMIN 2.7*   No results for input(s): LIPASE, AMYLASE in the last 168 hours. No results for input(s): AMMONIA in the last 168 hours. Coagulation Profile: No results for input(s): INR, PROTIME in the last 168 hours. Cardiac Enzymes: Recent Labs  Lab 07/20/21 1416  CKTOTAL 942*   BNP (last 3 results) No results for input(s): PROBNP in the last 8760 hours. HbA1C: No results for input(s): HGBA1C in the last 72 hours. CBG: No results for input(s): GLUCAP in the last 168 hours. Lipid Profile: No results for input(s): CHOL, HDL, LDLCALC, TRIG, CHOLHDL, LDLDIRECT in the last 72 hours. Thyroid Function Tests: No results for input(s): TSH, T4TOTAL, FREET4, T3FREE, THYROIDAB in the last 72 hours. Anemia Panel: No results for input(s): VITAMINB12, FOLATE, FERRITIN, TIBC, IRON, RETICCTPCT in the last 72 hours. Urine analysis:    Component Value Date/Time   COLORURINE AMBER (A) 09/17/2020 0121   APPEARANCEUR TURBID (A) 09/17/2020 0121   LABSPEC 1.031 (H) 09/17/2020 0121   PHURINE 8.0 09/17/2020 0121   GLUCOSEU NEGATIVE 09/17/2020 0121   HGBUR LARGE (A) 09/17/2020 0121   BILIRUBINUR NEGATIVE 09/17/2020 0121   KETONESUR NEGATIVE 09/17/2020 0121   PROTEINUR >=300 (A) 09/17/2020 0121   NITRITE NEGATIVE 09/17/2020 0121   LEUKOCYTESUR LARGE (A) 09/17/2020 0121    Radiological Exams on  Admission: CT HEAD WO CONTRAST (5MM)  Result Date: 07/20/2021  CLINICAL DATA:  Weakness, delirium EXAM: CT HEAD WITHOUT CONTRAST TECHNIQUE: Contiguous axial images were obtained from the base of the skull through the vertex without intravenous contrast. COMPARISON:  None. FINDINGS: Brain: There is no acute intracranial hemorrhage, extra-axial fluid collection, or acute infarct. There is mild global parenchymal volume loss with commensurate enlargement of the ventricular system. Hypodensity in the subcortical and periventricular white matter likely reflects sequela of mild chronic white matter microangiopathy. There is no mass lesion.  There is no midline shift. Vascular: No hyperdense vessel or unexpected calcification. Skull: Normal. Negative for fracture or focal lesion. Sinuses/Orbits: The imaged paranasal sinuses are clear. Bilateral lens implants are in place. The globes and orbits are otherwise unremarkable. Other: None. IMPRESSION: 1. No acute intracranial pathology. 2. Mild parenchymal volume loss and chronic white matter microangiopathy. Electronically Signed   By: Valetta Mole M.D.   On: 07/20/2021 15:13   DG Chest Portable 1 View  Result Date: 07/20/2021 CLINICAL DATA:  Weakness EXAM: PORTABLE CHEST 1 VIEW COMPARISON:  Chest radiograph 09/16/2020 FINDINGS: The cardiomediastinal silhouette is within normal limits. There is no focal consolidation or pulmonary edema. There is no pleural effusion or pneumothorax. There is no acute osseous abnormality. IMPRESSION: No radiographic evidence of acute cardiopulmonary process. Electronically Signed   By: Valetta Mole M.D.   On: 07/20/2021 15:11     Assessment/Plan Principal Problem:   Generalized weakness Active Problems:   Diabetes mellitus without complication (HCC)   Hypertension   GERD (gastroesophageal reflux disease)   Fall at home, initial encounter   Hypokalemia   Rhabdomyolysis   Decubitus ulcer of buttock, stage 3 ([place])       Patient is a 75 year old male admitted to the hospital for evaluation of generalized weakness    Generalized weakness/Fall Patient was found on the floor where he had been for about 3 days He denies having a fall but states that he fell 3 weeks ago and has right clavicular fracture Will place patient on fall precautions PT evaluation     Hypokalemia Unclear etiology may be related to poor oral intake Supplement potassium Check magnesium levels     Diabetes mellitus Maintain consistent carbohydrate diet Glycemic control with sliding scale insulin     Stage III decubitus ulcer with surrounding cellulitis Will place patient on IV Rocephin Wound care consult   Hypertension Continue metoprolol    History of prostate cancer Continue Casodex 50 mg daily   DVT prophylaxis: Lovenox  Code Status: full code  Family Communication: Greater than 50% of time was spent discussing patient's condition and plan of care with him and his wife at the bedside.  They verbalized understanding and agree with the plan Disposition Plan: Back to previous home environment Consults called: Physical therapy/wound care consult Status:At the time of admission, it appears that the appropriate admission status for this patient is inpatient. This is judged to be reasonable and necessary to provide the required intensity of service to ensure the patient's safety given the presenting symptoms, physical exam findings, and initial radiographic and laboratory data in the context of their comorbid conditions. Patient requires inpatient status due to high intensity of service, high risk for further deterioration and high frequency of surveillance required.     Collier Bullock MD [facility]     07/20/2021, 4:15 PM

## 2021-07-20 NOTE — ED Notes (Signed)
Two sets of BC, gray on ICE ,and green top sent to the lab.

## 2021-07-21 ENCOUNTER — Encounter: Payer: Self-pay | Admitting: Internal Medicine

## 2021-07-21 DIAGNOSIS — W19XXXA Unspecified fall, initial encounter: Secondary | ICD-10-CM | POA: Diagnosis not present

## 2021-07-21 DIAGNOSIS — Y92009 Unspecified place in unspecified non-institutional (private) residence as the place of occurrence of the external cause: Secondary | ICD-10-CM

## 2021-07-21 DIAGNOSIS — L89303 Pressure ulcer of unspecified buttock, stage 3: Secondary | ICD-10-CM | POA: Diagnosis not present

## 2021-07-21 DIAGNOSIS — R531 Weakness: Secondary | ICD-10-CM | POA: Diagnosis not present

## 2021-07-21 LAB — CBC
HCT: 30.1 % — ABNORMAL LOW (ref 39.0–52.0)
Hemoglobin: 10.1 g/dL — ABNORMAL LOW (ref 13.0–17.0)
MCH: 29.4 pg (ref 26.0–34.0)
MCHC: 33.6 g/dL (ref 30.0–36.0)
MCV: 87.5 fL (ref 80.0–100.0)
Platelets: 231 10*3/uL (ref 150–400)
RBC: 3.44 MIL/uL — ABNORMAL LOW (ref 4.22–5.81)
RDW: 15.6 % — ABNORMAL HIGH (ref 11.5–15.5)
WBC: 8.4 10*3/uL (ref 4.0–10.5)
nRBC: 0 % (ref 0.0–0.2)

## 2021-07-21 LAB — CK: Total CK: 967 U/L — ABNORMAL HIGH (ref 49–397)

## 2021-07-21 LAB — GLUCOSE, CAPILLARY: Glucose-Capillary: 266 mg/dL — ABNORMAL HIGH (ref 70–99)

## 2021-07-21 LAB — BASIC METABOLIC PANEL
Anion gap: 7 (ref 5–15)
BUN: 12 mg/dL (ref 8–23)
CO2: 29 mmol/L (ref 22–32)
Calcium: 7.6 mg/dL — ABNORMAL LOW (ref 8.9–10.3)
Chloride: 100 mmol/L (ref 98–111)
Creatinine, Ser: 0.79 mg/dL (ref 0.61–1.24)
GFR, Estimated: >60 mL/min (ref >60)
Glucose, Bld: 217 mg/dL — ABNORMAL HIGH (ref 70–99)
Potassium: 3.4 mmol/L — ABNORMAL LOW (ref 3.5–5.1)
Sodium: 136 mmol/L (ref 135–145)

## 2021-07-21 LAB — FERRITIN: Ferritin: 179 ng/mL (ref 24–336)

## 2021-07-21 LAB — IRON AND TIBC
Iron: 19 ug/dL — ABNORMAL LOW (ref 45–182)
Saturation Ratios: 9 % — ABNORMAL LOW (ref 17.9–39.5)
TIBC: 202 ug/dL — ABNORMAL LOW (ref 250–450)
UIBC: 183 ug/dL

## 2021-07-21 LAB — MAGNESIUM: Magnesium: 1.8 mg/dL (ref 1.7–2.4)

## 2021-07-21 LAB — FOLATE: Folate: 10.5 ng/mL (ref >5.9)

## 2021-07-21 LAB — VITAMIN B12: Vitamin B-12: 6343 pg/mL — ABNORMAL HIGH (ref 180–914)

## 2021-07-21 MED ORDER — POTASSIUM CHLORIDE CRYS ER 20 MEQ PO TBCR
40.0000 meq | EXTENDED_RELEASE_TABLET | Freq: Once | ORAL | Status: AC
  Administered 2021-07-21: 40 meq via ORAL
  Filled 2021-07-21: qty 2

## 2021-07-21 MED ORDER — ZINC OXIDE 40 % EX OINT
TOPICAL_OINTMENT | Freq: Three times a day (TID) | CUTANEOUS | Status: DC
  Administered 2021-07-21: 1 via TOPICAL
  Filled 2021-07-21 (×3): qty 113

## 2021-07-21 MED ORDER — INSULIN ASPART PROT & ASPART (70-30 MIX) 100 UNIT/ML ~~LOC~~ SUSP
15.0000 [IU] | Freq: Two times a day (BID) | SUBCUTANEOUS | Status: DC
  Administered 2021-07-21 – 2021-07-23 (×5): 15 [IU] via SUBCUTANEOUS
  Filled 2021-07-21: qty 10

## 2021-07-21 MED ORDER — APIXABAN 2.5 MG PO TABS
2.5000 mg | ORAL_TABLET | Freq: Two times a day (BID) | ORAL | Status: DC
  Administered 2021-07-21 – 2021-08-02 (×24): 2.5 mg via ORAL
  Filled 2021-07-21 (×24): qty 1

## 2021-07-21 NOTE — ED Notes (Signed)
Updated family member with pt's permission

## 2021-07-21 NOTE — Evaluation (Signed)
Physical Therapy Evaluation Patient Details Name: Christopher Collier MRN: [id number] DOB: Jan 03, 1946 Today's Date: 07/21/2021  History of Present Illness  Pt is a 75 y.o. male presenting to hospital 10/26 with generalized weakness (pt laid on floor on Monday and unable to get up since then).  Pt addmited with generalized weaknes/possible fall? (per chart pt fell 3 weeks ago and has R clavicular fx 06/28/21), hypokalemia, DM, and stage III decubitus ulcer with surrounding cellulitis.  PMH includes incontinence, DM, DVT, htn, MRSA, sleep apnea, UTI, and h/o prostate CA.  Clinical Impression  Prior to hospital admission, pt reports recently being ambulatory with walker (per recent orthopaedic f/u visit note 07/18/21 pt was spending more time in w/c recently) and lives with his wife in home with steps to navigate.  Pt appearing oriented but inconsistent with information during session.  Pt reporting (in terms of R distal clavicle fx) that MD told him to "watch it" and just do what felt right (per Dr. Steva Colder ([place]) note 07/18/21 (orthopaedic f/u visit): "I encouraged him to begin gently actively using his right upper extremity as tolerated"). Currently pt is min to mod assist logrolling to L in bed.  Therapist attempted to encourage out of bed mobility multiple times during session but pt talkative during session and when redirected pt declining OOB mobility each time (pt reports he will keep PT on his mind and try it tomorrow).  Tolerated LE ex's in bed with assist (pt's LE's appearing painful to touch and limited B hip and knee flexion noted).  Pt would benefit from skilled PT to address noted impairments and functional limitations (see below for any additional details).  Limited functional mobility evaluation able to be performed but based on pt's B LE weakness and prior to admission pt laying on floor and unable to get up, anticipate upon hospital discharge, pt would benefit from SNF  (will continue to monitor pt's progress during hospital stay and update PT follow-up recommendations as deemed appropriate).       Recommendations for follow up therapy are one component of a multi-disciplinary discharge planning process, led by the attending physician.  Recommendations may be updated based on patient status, additional functional criteria and insurance authorization.  Follow Up Recommendations Skilled nursing-short term rehab (<3 hours/day)    Assistance Recommended at Discharge Frequent or constant Supervision/Assistance  Functional Status Assessment Patient has had a recent decline in their functional status and demonstrates the ability to make significant improvements in function in a reasonable and predictable amount of time.  Equipment Recommendations  Other (comment) (TBD)    Recommendations for Other Services OT consult     Precautions / Restrictions Precautions Precautions: Fall Restrictions Weight Bearing Restrictions: Yes Other Position/Activity Restrictions: Per Dr. Steva Colder ([place]) note 07/18/21 (orthopaedic f/u visit): "I encouraged him to begin gently actively using his right upper extremity as tolerated."      Mobility  Bed Mobility Overal bed mobility: Needs Assistance Bed Mobility: Rolling Rolling: Min assist;Mod assist         General bed mobility comments: assist to logroll towards L side; pt declined to sit up on edge of bed so unable to assess further bed mobility    Transfers                   General transfer comment: pt declined to sit up on edge of bed so unable to assess standing    Ambulation/Gait  General Gait Details: pt declined to sit up on edge of bed so unable to assess ambulation  Stairs            Wheelchair Mobility    Modified Rankin (Stroke Patients Only)       Balance                                             Pertinent Vitals/Pain  Pain Assessment: Faces Faces Pain Scale: Hurts a little bit Pain Location: pt's buttocks Pain Descriptors / Indicators: Discomfort Pain Intervention(s): Limited activity within patient's tolerance;Monitored during session;Repositioned Vitals (HR and O2 on room air) stable and WFL throughout treatment session.    Home Living Family/patient expects to be discharged to:: Private residence Living Arrangements: Spouse/significant other;Children (pt's daughter (has disabilities s/p car accident in 2014)) Available Help at Discharge: Family Type of Home: House Home Access: Ramped entrance (garage into home)     Alternate Level Stairs-Number of Steps: 8 steps with L railing Home Layout: Two level;Bed/bath upstairs Home Equipment: Conservation officer, nature (2 wheels);Cane - single point Additional Comments: Sleeps in recliner at home since clavicle fx    Prior Function Prior Level of Function : Independent/Modified Independent             Mobility Comments: Pt reports recently ambulating with RW (was using SPC more than RW prior to clavicle fx).  Per Dr. Steva Colder orthopaedic f/u visit note 07/18/21 pt has been "spending more time in a wheelchair recently".       Hand Dominance        Extremity/Trunk Assessment        Lower Extremity Assessment Lower Extremity Assessment: RLE deficits/detail;LLE deficits/detail (B DF to neutral AROM/PROM) RLE Deficits / Details: limited hip flexion and knee flexion AROM (per pt comfort) RLE: Unable to fully assess due to pain LLE Deficits / Details: limited hip flexion and knee flexion AROM (per pt comfort) LLE: Unable to fully assess due to pain    Cervical / Trunk Assessment Cervical / Trunk Assessment: Normal  Communication   Communication: No difficulties  Cognition Arousal/Alertness: Awake/alert Behavior During Therapy: WFL for tasks assessed/performed                                   General Comments: A&O x4.  Pt inconsistent  with information at times.        General Comments  Nursing cleared pt for participation in physical therapy.  Pt agreeable to limited PT session.     Exercises General Exercises - Lower Extremity Ankle Circles/Pumps: AROM;Strengthening;Both;10 reps;Supine Quad Sets: AROM;Strengthening;Both;10 reps;Supine Heel Slides: AAROM;Strengthening;Both;10 reps;Supine Hip ABduction/ADduction: AAROM;Strengthening;Both;10 reps;Supine   Assessment/Plan    PT Assessment Patient needs continued PT services  PT Problem List Decreased strength;Decreased activity tolerance;Decreased balance;Decreased mobility;Decreased knowledge of use of DME;Decreased knowledge of precautions;Pain       PT Treatment Interventions DME instruction;Gait training;Stair training;Functional mobility training;Therapeutic activities;Therapeutic exercise;Balance training;Patient/family education    PT Goals (Current goals can be found in the Care Plan section)  Acute Rehab PT Goals Patient Stated Goal: to improve mobility PT Goal Formulation: With patient Time For Goal Achievement: 08/04/21 Potential to Achieve Goals: Good    Frequency Min 2X/week   Barriers to discharge Decreased caregiver support      Co-evaluation  AM-PAC PT "6 Clicks" Mobility  Outcome Measure Help needed turning from your back to your side while in a flat bed without using bedrails?: A Lot Help needed moving from lying on your back to sitting on the side of a flat bed without using bedrails?: A Lot Help needed moving to and from a bed to a chair (including a wheelchair)?: A Lot Help needed standing up from a chair using your arms (e.g., wheelchair or bedside chair)?: A Lot Help needed to walk in hospital room?: Total Help needed climbing 3-5 steps with a railing? : Total 6 Click Score: 10    End of Session   Activity Tolerance: Patient limited by pain Patient left: in bed;with call bell/phone within reach;with  nursing/sitter in room;Other (comment) (B prevalon boots in place) Nurse Communication: Precautions;Other (comment) (pt requesting water) PT Visit Diagnosis: Other abnormalities of gait and mobility (R26.89);Muscle weakness (generalized) (M62.81);History of falling (Z91.81);Pain    Time: 0931-1216 PT Time Calculation (min) (ACUTE ONLY): 38 min   Charges:   PT Evaluation $PT Eval Low Complexity: 1 Low PT Treatments $Therapeutic Exercise: 8-22 mins       Leitha Bleak, PT 07/21/21, 5:09 PM

## 2021-07-21 NOTE — ED Notes (Signed)
Secretary informed of need for mattress replacement and boot.

## 2021-07-21 NOTE — ED Notes (Signed)
Pt placed in pneumatic wound management bed. Pt tolerated transfer well. Male purewick, Ivs, and monitor replaced. Pt resting comfortably.
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
## 2021-07-21 NOTE — Progress Notes (Signed)
PT Cancellation Note  Patient Details Name: Christopher Collier MRN: [id number] DOB: 1946/02/15   Cancelled Treatment:    Reason Eval/Treat Not Completed: Patient declined, no reason specified.  PT consult received.  Chart reviewed.  Pt resting in ED stretcher bed upon PT arrival and pt declined PT session--pt stating "I don't feel like it right now".  Will re-attempt PT session at a later date/time.  Leitha Bleak, PT 07/21/21, 8:49 AM

## 2021-07-21 NOTE — ED Notes (Signed)
Pt incontinent of stool, pt cleaned, bedding changed, decub pad placed on sacral blister. Per wound care nurse, no dressing on MASD on buttocks.  Pt set up to eat breakfast.

## 2021-07-22 DIAGNOSIS — E876 Hypokalemia: Secondary | ICD-10-CM

## 2021-07-22 DIAGNOSIS — R531 Weakness: Secondary | ICD-10-CM | POA: Diagnosis not present

## 2021-07-22 DIAGNOSIS — L89303 Pressure ulcer of unspecified buttock, stage 3: Secondary | ICD-10-CM | POA: Diagnosis not present

## 2021-07-22 LAB — GLUCOSE, CAPILLARY
Glucose-Capillary: 122 mg/dL — ABNORMAL HIGH (ref 70–99)
Glucose-Capillary: 142 mg/dL — ABNORMAL HIGH (ref 70–99)

## 2021-07-22 LAB — BASIC METABOLIC PANEL
Anion gap: 8 (ref 5–15)
BUN: 12 mg/dL (ref 8–23)
CO2: 29 mmol/L (ref 22–32)
Calcium: 7.8 mg/dL — ABNORMAL LOW (ref 8.9–10.3)
Chloride: 100 mmol/L (ref 98–111)
Creatinine, Ser: 0.72 mg/dL (ref 0.61–1.24)
GFR, Estimated: >60 mL/min (ref >60)
Glucose, Bld: 124 mg/dL — ABNORMAL HIGH (ref 70–99)
Potassium: 3.6 mmol/L (ref 3.5–5.1)
Sodium: 137 mmol/L (ref 135–145)

## 2021-07-22 MED ORDER — FERROUS SULFATE 325 (65 FE) MG PO TABS
325.0000 mg | ORAL_TABLET | ORAL | Status: DC
  Administered 2021-07-22 – 2021-08-02 (×7): 325 mg via ORAL
  Filled 2021-07-22 (×8): qty 1

## 2021-07-22 NOTE — TOC Initial Note (Addendum)
Transition of Care [facility]) - Initial/Assessment Note    Patient Details  Name: Christopher Collier MRN: [id number] Date of Birth: 1946/08/13  [facility] CM/SW Contact:    Pete Pelt, RN Phone Number: 07/22/2021, 12:45 PM  Clinical Narrative:  Patient states he lives at home with family, he states that he has a daughter, who is on vacation until Thursday of next week.  He feels he has appropriate support at home but accepts recommendation of SNF "if it helps me to get better."  Patient denies any concerns at home at this time.  SNF bed search started, will monitor for bed offers TOC contact information provided, TOC to follow to discharge.            Addendum 12:56pm:  Patient asked TOC to contact family about Skilled Facility placement, Message left for daughter.  Spoke to spouse via patient's home phone number.  Spouse states that patient only developed wounds last week.    She states that when she returned from a medical appointment, patient was laying on the floor, and informed her that he wanted to lay on the floor.  Spouse is disabled and unable to lift patient.  She states Home Health nurse came to house and requested transport to the hospital for patient.  Patient's spouse states daughter is a Marine scientist and would like to consult her prior to SNF bed search.  They do not want bed search without patient's daughter, who is currently flying to the [place]  Patient's daughter is expected back Tuesday or Wednesday of next week.  Patient's spouse is not willing to initiate process without daughter speaking to staff first.  Toc contact information provided to spouse, TOC to follow.   Expected Discharge Plan: Skilled Nursing Facility Barriers to Discharge: Continued Medical Work up   Patient Goals and CMS Choice Patient states their goals for this hospitalization and ongoing recovery are:: To go to a facility if it gets me stronger      Expected Discharge Plan and  Services Expected Discharge Plan: [place]   Discharge Planning Services: CM Consult Post Acute Care Choice: [place] Living arrangements for the past 2 months: Single Family Home                 DME Arranged:  (anticipated to go to SNF)         HH Arranged:  (Anticipated DC to sNF)          Prior Living Arrangements/Services Living arrangements for the past 2 months: Single Family Home Lives with:: Relatives, Self Patient language and need for interpreter reviewed:: Yes (No interpreter required) Do you feel safe going back to the place where you live?: Yes      Need for Family Participation in Patient Care: Yes (Comment) Care giver support system in place?: Yes (comment)   Criminal Activity/Legal Involvement Pertinent to Current Situation/Hospitalization: No - Comment as needed  Activities of Daily Living Home Assistive Devices/Equipment: Gilford Rile (specify type) ADL Screening (condition at time of admission) Patient's cognitive ability adequate to safely complete daily activities?: Yes Is the patient deaf or have difficulty hearing?: No Does the patient have difficulty seeing, even when wearing glasses/contacts?: No Does the patient have difficulty concentrating, remembering, or making decisions?: No Patient able to express need for assistance with ADLs?: Yes Does the patient have difficulty dressing or bathing?: No Independently performs ADLs?: No Does the patient have difficulty walking or climbing stairs?: No Weakness of  Legs: Both Weakness of Arms/Hands: None  Permission Sought/Granted Permission sought to share information with : Case Manager, Chartered certified accountant granted to share information with : Yes, Verbal Permission Granted     Permission granted to share info w AGENCY: Prospective SNF agencies        Emotional Assessment Appearance:: Appears stated age Attitude/Demeanor/Rapport: Gracious,  Engaged Affect (typically observed): Pleasant, Appropriate Orientation: : Oriented to Self, Oriented to Place, Oriented to  Time, Oriented to Situation Alcohol / Substance Use: Not Applicable Psych Involvement: No (comment)  Admission diagnosis:  Cellulitis of buttock [L03.317] Hypokalemia [E87.6] Fall [W19.XXXA] Generalized weakness [R53.1] Patient Active Problem List   Diagnosis Date Noted   Generalized weakness 07/20/2021   Diabetes mellitus without complication ([place])    Hypertension    GERD (gastroesophageal reflux disease)    Fall at home, initial encounter    Hypokalemia    Rhabdomyolysis    Decubitus ulcer of buttock, stage 3 (HCC)    PCP:  Orpah Greek, DO Pharmacy:   [id number] - [place], [place] - [place]. [address] [place] [address] Phone: [phone] Fax: [phone]     Social Determinants of Health (SDOH) Interventions    Readmission Risk Interventions No flowsheet data found.

## 2021-07-22 NOTE — Evaluation (Signed)
Occupational Therapy Evaluation Patient Details Name: Christopher Collier MRN: [id number] DOB: 07-Feb-1946 Today's Date: 07/22/2021   History of Present Illness Pt is a 75 y.o. male presenting to hospital 10/26 with generalized weakness (pt laid on floor on Monday and unable to get up since then).  Pt addmited with generalized weaknes/possible fall? (per chart pt fell 3 weeks ago and has R clavicular fx 06/28/21), hypokalemia, DM, and stage III decubitus ulcer with surrounding cellulitis.  PMH includes incontinence, DM, DVT, htn, MRSA, sleep apnea, UTI, and h/o prostate CA.   Clinical Impression   Patient presenting with decreased Ind in self care, balance, functional mobility/transfers, endurance, and safety awareness. Patient reports use of SPC at home but having multiple falls. He lives with wife and reports independence with self care tasks as well. No family present to confirm baseline. Patient feeding self and bring cup to mouth with use of RUE and no c/o pain. Pt needs total A to transfer to EOB and pt needing significant assist with trunk and LEs. Pt seated on EOB for ~ 10 minutes with mod A sitting balance progressing to close supervision with cuing and manual assist for anterior weight shift while seated on EOB. Pt refused standing at this time and returned to bed level with total A. Patient will benefit from acute OT to increase overall independence in the areas of ADLs, functional mobility, and safety awareness  in order to safely discharge to next venue of care.      Recommendations for follow up therapy are one component of a multi-disciplinary discharge planning process, led by the attending physician.  Recommendations may be updated based on patient status, additional functional criteria and insurance authorization.   Follow Up Recommendations  Skilled nursing-short term rehab (<3 hours/day)    Assistance Recommended at Discharge Intermittent Supervision/Assistance  Functional Status  Assessment  Patient has had a recent decline in their functional status and demonstrates the ability to make significant improvements in function in a reasonable and predictable amount of time.  Equipment Recommendations  Other (comment) (defer to next venue of care)       Precautions / Restrictions Precautions Precautions: Fall Restrictions Weight Bearing Restrictions: Yes RUE Weight Bearing: Weight bearing as tolerated Other Position/Activity Restrictions: Per Dr. Steva Colder ([place]) note 07/18/21 (orthopaedic f/u visit): "I encouraged him to begin gently actively using his right upper extremity as tolerated."      Mobility Bed Mobility Overal bed mobility: Needs Assistance Bed Mobility: Supine to Sit;Sit to Supine     Supine to sit: Total assist Sit to supine: Total assist   General bed mobility comments: heavy assist with trunk and B LEs with cuing for technique and hand placement    Transfers                   General transfer comment: Pt declined standing once on EOB and reports, " I will try later today"      Balance Overall balance assessment: Needs assistance Sitting-balance support: Feet supported;Bilateral upper extremity supported Sitting balance-Leahy Scale: Fair Sitting balance - Comments: min A but progressed to close supervision                                   ADL either performed or assessed with clinical judgement   ADL Overall ADL's : Needs assistance/impaired Eating/Feeding: Set up;Bed level   Grooming: Wash/dry hands;Wash/dry face;Standing;Set up  Lower Body Dressing: Total assistance                       Vision Patient Visual Report: No change from baseline              Pertinent Vitals/Pain Pain Assessment: Faces Faces Pain Scale: Hurts a little bit Pain Location: pt's buttocks Pain Descriptors / Indicators: Discomfort Pain Intervention(s): Limited activity  within patient's tolerance;Premedicated before session     Hand Dominance     Extremity/Trunk Assessment Upper Extremity Assessment Upper Extremity Assessment: RUE deficits/detail RUE Deficits / Details: limited secondary to discomfort from clavicle fx   Lower Extremity Assessment Lower Extremity Assessment: Defer to PT evaluation       Communication Communication Communication: No difficulties   Cognition Arousal/Alertness: Awake/alert Behavior During Therapy: WFL for tasks assessed/performed                                   General Comments: A&O x4.  Pt inconsistent with information at times.                Home Living Family/patient expects to be discharged to:: Private residence Living Arrangements: Spouse/significant other Available Help at Discharge: Family Type of Home: House Home Access: [place]: Two level;Bed/bath upstairs Alternate Level Stairs-Number of Steps: 8 steps with L railing Alternate Level Stairs-Rails: Left Bathroom Shower/Tub: Tub/shower unit   Bathroom Toilet: Handicapped height     Home Equipment: Conservation officer, nature (2 wheels);Cane - single point   Additional Comments: Sleeps in recliner at home since clavicle fx      Prior Functioning/Environment Prior Level of Function : Independent/Modified Independent             Mobility Comments: Pt reports recently ambulating with RW (was using SPC more than RW prior to clavicle fx).  Per Dr. Steva Colder orthopaedic f/u visit note 07/18/21 pt has been "spending more time in a wheelchair recently".          OT Problem List: Decreased strength;Pain;Decreased activity tolerance;Decreased safety awareness;Impaired balance (sitting and/or standing);Decreased knowledge of use of DME or AE;Impaired vision/perception;Decreased knowledge of precautions      OT Treatment/Interventions: Self-care/ADL training;Therapeutic exercise;Therapeutic activities;Energy  conservation;DME and/or AE instruction;Patient/family education;Manual therapy;Balance training    OT Goals(Current goals can be found in the care plan section) Acute Rehab OT Goals Patient Stated Goal: to get my breakfast OT Goal Formulation: With patient Time For Goal Achievement: 08/05/21 Potential to Achieve Goals: Fair  OT Frequency: Min 2X/week   Barriers to D/C:    none known at this time          AM-PAC OT "6 Clicks" Daily Activity     Outcome Measure Help from another person eating meals?: A Little Help from another person taking care of personal grooming?: A Little Help from another person toileting, which includes using toliet, bedpan, or urinal?: Total Help from another person bathing (including washing, rinsing, drying)?: A Lot Help from another person to put on and taking off regular upper body clothing?: A Lot Help from another person to put on and taking off regular lower body clothing?: Total 6 Click Score: 12   End of Session Nurse Communication: Mobility status  Activity Tolerance: Patient limited by fatigue Patient left: with call bell/phone within reach;with bed alarm set;in bed  OT Visit Diagnosis: Unsteadiness on feet (R26.81);Repeated falls (R29.6);Muscle weakness (  generalized) (M62.81);History of falling (Z91.81)                Time: 6599-3570 OT Time Calculation (min): 26 min Charges:  OT General Charges $OT Visit: 1 Visit OT Evaluation $OT Eval Moderate Complexity: 1 Mod OT Treatments $Therapeutic Activity: 8-22 mins  Darleen Crocker, MS, OTR/L , CBIS ascom [phone]  07/22/21, 12:55 PM

## 2021-07-22 NOTE — Progress Notes (Addendum)
Progress Note    Christopher Collier  IRJ:[id number] DOB: 1945-12-02  DOA: 07/20/2021 PCP: Orpah Greek, DO      Brief Narrative:    Medical records reviewed and are as summarized below:  Christopher Collier is a 75 y.o. male with medical history significant for prostate cancer, hypertension, insulin-dependent diabetes mellitus, CAD, OSA, history of recurrent DVT, chronic anemia, who was brought to the hospital because of generalized weakness.  He had been laying on the floor since Monday, 07/18/2021 and had been unable to get up by himself.  He was admitted to the hospital for generalized weakness, hypomagnesemia and hypokalemia.  He was also found to have stage III decubitus ulcers on bilateral buttocks and deep tissue injury on the sacrum.    Assessment/Plan:   Principal Problem:   Generalized weakness Active Problems:   Diabetes mellitus without complication (HCC)   Hypertension   GERD (gastroesophageal reflux disease)   Fall at home, initial encounter   Hypokalemia   Rhabdomyolysis   Decubitus ulcer of buttock, stage 3 (HCC)   Body mass index is 29.76 kg/m.  Generalized weakness/fall at home: PT recommends discharge to SNF.  Continue fall precautions.  Hypokalemia and hypomagnesemia: Improved  Iron deficiency anemia: Start ferrous sulfate.  He had colonoscopy on 07/09/2020 which showed transverse colon polyp and rectosigmoid polyp (pathology was negative for malignancy).  B12 level is elevated at 6,343.  Insulin-dependent diabetes mellitus with hyperglycemia: He takes NovoLog Mix 70/30 30 units in the morning,15 units in the afternoon and 30 units in the evening.  Use NovoLog mix 15 units twice daily for now.  Adjust insulin dose as needed based on glucose levels.  Stage III decubitus ulcers on bilateral buttocks, deep tissue injury on the sacrum: Continue local wound care.    Mildly elevated CK level (942, 967) : CK level stable.   History of  recurrent DVT: Continue Eliquis  History of prostate cancer: Continue Casodex  Minimally displaced right distal clavicular fracture (06/28/2021): Outpatient follow-up with orthopedic surgeon.   Diet Order             Diet 2 gram sodium Room service appropriate? Yes; Fluid consistency: Thin  Diet effective now                      Consultants: None  Procedures: None    Medications:    apixaban  2.5 mg Oral BID   bicalutamide  50 mg Oral Daily   ferrous sulfate  325 mg Oral QODAY   insulin aspart protamine- aspart  15 Units Subcutaneous BID WC   liver oil-zinc oxide   Topical TID   metoprolol tartrate  25 mg Oral QAC breakfast   oxybutynin  10 mg Oral QHS   pantoprazole  40 mg Oral Daily   Continuous Infusions:     Anti-infectives (From admission, onward)    Start     Dose/Rate Route Frequency Ordered Stop   07/21/21 1600  cefTRIAXone (ROCEPHIN) 2 g in sodium chloride 0.9 % 100 mL IVPB  Status:  Discontinued        2 g 200 mL/hr over 30 Minutes Intravenous Every 24 hours 07/20/21 1612 07/21/21 1552   07/20/21 1545  vancomycin (VANCOREADY) IVPB 2000 mg/400 mL        2,000 mg 200 mL/hr over 120 Minutes Intravenous  Once 07/20/21 1533 07/20/21 1934   07/20/21 1530  cefTRIAXone (ROCEPHIN) 2 g in sodium chloride 0.9 % 100  mL IVPB        2 g 200 mL/hr over 30 Minutes Intravenous  Once 07/20/21 1529 07/20/21 1718              Family Communication/Anticipated D/C date and plan/Code Status   DVT prophylaxis: apixaban (ELIQUIS) tablet 2.5 mg Start: 07/21/21 2200 apixaban (ELIQUIS) tablet 2.5 mg     Code Status: Full Code  Family Communication: None Disposition Plan: Plan to discharge to home versus SNF   Status is: Inpatient  Remains inpatient appropriate because: Generalized weakness           Subjective:   Interval events noted.  She complains of generalized weakness.  Objective:    Vitals:   07/22/21 0000 07/22/21 0355  07/22/21 0746 07/22/21 1141  BP:  126/64 119/64 127/66  Pulse:  72 72 78  Resp: 20 (!) 22 18 18   Temp:  99.2 F (37.3 C) 98.3 F (36.8 C) 98.4 F (36.9 C)  TempSrc:  Oral    SpO2:  98% 95% 97%  Weight:      Height:       No data found.   Intake/Output Summary (Last 24 hours) at 07/22/2021 1245 Last data filed at 07/21/2021 2352 Gross per 24 hour  Intake 118 ml  Output 550 ml  Net -432 ml   Filed Weights   07/20/21 1412  Weight: 86.2 kg    Exam:  GEN: NAD SKIN: Stage III decubitus ulcers on bilateral buttocks.  Deep tissue injury on his sacrum EYES: No pallor or icterus ENT: MMM CV: RRR PULM: CTA B ABD: soft, ND, NT, +BS CNS: AAO x 3, non focal EXT: Chronic bilateral leg edema and hyperpigmented skin changes on the legs      Data Reviewed:   I have personally reviewed following labs and imaging studies:  Labs: Labs show the following:   Basic Metabolic Panel: Recent Labs  Lab 07/20/21 1416 07/20/21 1609 07/21/21 0550 07/22/21 0532  NA 138  --  136 137  K 2.7*  --  3.4* 3.6  CL 96*  --  100 100  CO2 30  --  29 29  GLUCOSE 272*  --  217* 124*  BUN 12  --  12 12  CREATININE 0.82  --  0.79 0.72  CALCIUM 8.1*  --  7.6* 7.8*  MG  --  1.5* 1.8  --    GFR Estimated Creatinine Clearance: 84.9 mL/min (by C-G formula based on SCr of 0.72 mg/dL). Liver Function Tests: Recent Labs  Lab 07/20/21 1416  AST 41  ALT 17  ALKPHOS 128*  BILITOT 1.6*  PROT 6.8  ALBUMIN 2.7*   No results for input(s): LIPASE, AMYLASE in the last 168 hours. No results for input(s): AMMONIA in the last 168 hours. Coagulation profile No results for input(s): INR, PROTIME in the last 168 hours.  CBC: Recent Labs  Lab 07/20/21 1416 07/21/21 0550  WBC 10.5 8.4  NEUTROABS 8.9*  --   HGB 11.5* 10.1*  HCT 34.8* 30.1*  MCV 86.4 87.5  PLT 283 231   Cardiac Enzymes: Recent Labs  Lab 07/20/21 1416 07/21/21 0858  CKTOTAL 942* 967*   BNP (last 3 results) No results  for input(s): PROBNP in the last 8760 hours. CBG: Recent Labs  Lab 07/21/21 1838 07/22/21 0920  GLUCAP 266* 122*   D-Dimer: No results for input(s): DDIMER in the last 72 hours. Hgb A1c: No results for input(s): HGBA1C in the last 72 hours. Lipid Profile:  No results for input(s): CHOL, HDL, LDLCALC, TRIG, CHOLHDL, LDLDIRECT in the last 72 hours. Thyroid function studies: No results for input(s): TSH, T4TOTAL, T3FREE, THYROIDAB in the last 72 hours.  Invalid input(s): FREET3 Anemia work up: Recent Labs    07/21/21 0550 07/21/21 1835  VITAMINB12  --  6,343*  FOLATE 10.5  --   FERRITIN 179  --   TIBC 202*  --   IRON 19*  --    Sepsis Labs: Recent Labs  Lab 07/20/21 1416 07/20/21 1609 07/20/21 1719 07/21/21 0550  WBC 10.5  --   --  8.4  LATICACIDVEN  --  1.4 1.4  --     Microbiology Recent Results (from the past 240 hour(s))  Resp Panel by RT-PCR (Flu A&B, Covid) Nasopharyngeal Swab     Status: None   Collection Time: 07/20/21  2:16 PM   Specimen: Nasopharyngeal Swab; Nasopharyngeal(NP) swabs in vial transport medium  Result Value Ref Range Status   SARS Coronavirus 2 by RT PCR NEGATIVE NEGATIVE Final    Comment: (NOTE) SARS-CoV-2 target nucleic acids are NOT DETECTED.  The SARS-CoV-2 RNA is generally detectable in upper respiratory specimens during the acute phase of infection. The lowest concentration of SARS-CoV-2 viral copies this assay can detect is 138 copies/mL. A negative result does not preclude SARS-Cov-2 infection and should not be used as the sole basis for treatment or other patient management decisions. A negative result may occur with  improper specimen collection/handling, submission of specimen other than nasopharyngeal swab, presence of viral mutation(s) within the areas targeted by this assay, and inadequate number of viral copies(<138 copies/mL). A negative result must be combined with clinical observations, patient history, and  epidemiological information. The expected result is Negative.  Fact Sheet for Patients:  EntrepreneurPulse.com.au  Fact Sheet for Healthcare Providers:  IncredibleEmployment.be  This test is no t yet approved or cleared by the [place] FDA and  has been authorized for detection and/or diagnosis of SARS-CoV-2 by FDA under an Emergency Use Authorization (EUA). This EUA will remain  in effect (meaning this test can be used) for the duration of the COVID-19 declaration under Section 564(b)(1) of the Act, 21 U.S.C.section 360bbb-3(b)(1), unless the authorization is terminated  or revoked sooner.       Influenza A by PCR NEGATIVE NEGATIVE Final   Influenza B by PCR NEGATIVE NEGATIVE Final    Comment: (NOTE) The Xpert Xpress SARS-CoV-2/FLU/RSV plus assay is intended as an aid in the diagnosis of influenza from Nasopharyngeal swab specimens and should not be used as a sole basis for treatment. Nasal washings and aspirates are unacceptable for Xpert Xpress SARS-CoV-2/FLU/RSV testing.  Fact Sheet for Patients: EntrepreneurPulse.com.au  Fact Sheet for Healthcare Providers: IncredibleEmployment.be  This test is not yet approved or cleared by the [place] FDA and has been authorized for detection and/or diagnosis of SARS-CoV-2 by FDA under an Emergency Use Authorization (EUA). This EUA will remain in effect (meaning this test can be used) for the duration of the COVID-19 declaration under Section 564(b)(1) of the Act, 21 U.S.C. section 360bbb-3(b)(1), unless the authorization is terminated or revoked.  Performed at [facility], [address] [place], [place] [address]   Blood culture (routine x 2)     Status: None (Preliminary result)   Collection Time: 07/20/21  4:09 PM   Specimen: BLOOD  Result Value Ref Range Status   Specimen Description BLOOD BLOOD RIGHT HAND  Final   Special  Requests   Final  BOTTLES DRAWN AEROBIC AND ANAEROBIC Blood Culture adequate volume   Culture   Final    NO GROWTH 2 DAYS Performed at [facility], [address] [place], [place] [address]    Report Status PENDING  Incomplete  Blood culture (routine x 2)     Status: None (Preliminary result)   Collection Time: 07/20/21  4:15 PM   Specimen: BLOOD  Result Value Ref Range Status   Specimen Description BLOOD RIGHT ANTECUBITAL  Final   Special Requests   Final    BOTTLES DRAWN AEROBIC AND ANAEROBIC Blood Culture adequate volume   Culture   Final    NO GROWTH 2 DAYS Performed at [facility], [address] [place], [place] [address]    Report Status PENDING  Incomplete    Procedures and diagnostic studies:  CT HEAD WO CONTRAST (5MM)  Result Date: 07/20/2021 CLINICAL DATA:  Weakness, delirium EXAM: CT HEAD WITHOUT CONTRAST TECHNIQUE: Contiguous axial images were obtained from the base of the skull through the vertex without intravenous contrast. COMPARISON:  None. FINDINGS: Brain: There is no acute intracranial hemorrhage, extra-axial fluid collection, or acute infarct. There is mild global parenchymal volume loss with commensurate enlargement of the ventricular system. Hypodensity in the subcortical and periventricular white matter likely reflects sequela of mild chronic white matter microangiopathy. There is no mass lesion.  There is no midline shift. Vascular: No hyperdense vessel or unexpected calcification. Skull: Normal. Negative for fracture or focal lesion. Sinuses/Orbits: The imaged paranasal sinuses are clear. Bilateral lens implants are in place. The globes and orbits are otherwise unremarkable. Other: None. IMPRESSION: 1. No acute intracranial pathology. 2. Mild parenchymal volume loss and chronic white matter microangiopathy. Electronically Signed   By: Valetta Mole M.D.   On: 07/20/2021 15:13   DG Chest Portable 1 View  Result Date: 07/20/2021 CLINICAL  DATA:  Weakness EXAM: PORTABLE CHEST 1 VIEW COMPARISON:  Chest radiograph 09/16/2020 FINDINGS: The cardiomediastinal silhouette is within normal limits. There is no focal consolidation or pulmonary edema. There is no pleural effusion or pneumothorax. There is no acute osseous abnormality. IMPRESSION: No radiographic evidence of acute cardiopulmonary process. Electronically Signed   By: Valetta Mole M.D.   On: 07/20/2021 15:11               LOS: 2 days   Taylie Helder  [facility]   Pager on [email or web address]. If 7PM-7AM, please contact night-coverage at [email or web address]     07/22/2021, 12:45 PM

## 2021-07-23 DIAGNOSIS — W19XXXA Unspecified fall, initial encounter: Secondary | ICD-10-CM | POA: Diagnosis not present

## 2021-07-23 DIAGNOSIS — E876 Hypokalemia: Secondary | ICD-10-CM | POA: Diagnosis not present

## 2021-07-23 DIAGNOSIS — R531 Weakness: Secondary | ICD-10-CM | POA: Diagnosis not present

## 2021-07-23 DIAGNOSIS — Y92009 Unspecified place in unspecified non-institutional (private) residence as the place of occurrence of the external cause: Secondary | ICD-10-CM | POA: Diagnosis not present

## 2021-07-23 LAB — GLUCOSE, CAPILLARY: Glucose-Capillary: 93 mg/dL (ref 70–99)

## 2021-07-23 NOTE — Progress Notes (Signed)
Progress Note    Christopher Collier  BWI:[id number] DOB: 1945/09/29  DOA: 07/20/2021 PCP: Orpah Greek, DO      Brief Narrative:    Medical records reviewed and are as summarized below:  Christopher Collier is a 75 y.o. male with medical history significant for prostate cancer, hypertension, insulin-dependent diabetes mellitus, CAD, OSA, history of recurrent DVT, chronic anemia, who was brought to the hospital because of generalized weakness.  He had been laying on the floor since Monday, 07/18/2021 and had been unable to get up by himself.  He was admitted to the hospital for generalized weakness, hypomagnesemia and hypokalemia.  He was also found to have stage III decubitus ulcers on bilateral buttocks and deep tissue injury on the sacrum.    Assessment/Plan:   Principal Problem:   Generalized weakness Active Problems:   Diabetes mellitus without complication (HCC)   Hypertension   GERD (gastroesophageal reflux disease)   Fall at home, initial encounter   Hypokalemia   Rhabdomyolysis   Decubitus ulcer of buttock, stage 3 (HCC)   Body mass index is 41.14 kg/m.  Generalized weakness/fall at home: PT recommends discharge to SNF.  Continue fall precautions.  Hypokalemia and hypomagnesemia: Improved  Iron deficiency anemia: Continue Ferrous sulfate. He had colonoscopy on 07/09/2020 which showed transverse colon polyp and rectosigmoid polyp (pathology was negative for malignancy).  B12 level is elevated at 6,343.  Insulin-dependent diabetes mellitus with hyperglycemia: He takes NovoLog Mix 70/30 30 units in the morning,15 units in the afternoon and 30 units in the evening.  Continue Novolog Mix 15 units bid.  Stage III decubitus ulcers on bilateral buttocks, deep tissue injury on the sacrum: Continue local wound care.    Mildly elevated CK level (942, 967) : CK level stable.   History of recurrent DVT: Continue Eliquis  History of prostate cancer: Continue  Casodex  Minimally displaced right distal clavicular fracture (06/28/2021): Outpatient follow-up with orthopedic surgeon.   Diet Order             Diet 2 gram sodium Room service appropriate? Yes; Fluid consistency: Thin  Diet effective now                      Consultants: None  Procedures: None    Medications:    apixaban  2.5 mg Oral BID   bicalutamide  50 mg Oral Daily   ferrous sulfate  325 mg Oral QODAY   insulin aspart protamine- aspart  15 Units Subcutaneous BID WC   liver oil-zinc oxide   Topical TID   metoprolol tartrate  25 mg Oral QAC breakfast   oxybutynin  10 mg Oral QHS   pantoprazole  40 mg Oral Daily   Continuous Infusions:     Anti-infectives (From admission, onward)    Start     Dose/Rate Route Frequency Ordered Stop   07/21/21 1600  cefTRIAXone (ROCEPHIN) 2 g in sodium chloride 0.9 % 100 mL IVPB  Status:  Discontinued        2 g 200 mL/hr over 30 Minutes Intravenous Every 24 hours 07/20/21 1612 07/21/21 1552   07/20/21 1545  vancomycin (VANCOREADY) IVPB 2000 mg/400 mL        2,000 mg 200 mL/hr over 120 Minutes Intravenous  Once 07/20/21 1533 07/20/21 1934   07/20/21 1530  cefTRIAXone (ROCEPHIN) 2 g in sodium chloride 0.9 % 100 mL IVPB        2 g 200 mL/hr over  30 Minutes Intravenous  Once 07/20/21 1529 07/20/21 1718              Family Communication/Anticipated D/C date and plan/Code Status   DVT prophylaxis: apixaban (ELIQUIS) tablet 2.5 mg Start: 07/21/21 2200 apixaban (ELIQUIS) tablet 2.5 mg     Code Status: Full Code  Family Communication: None Disposition Plan: Plan to discharge to home versus SNF   Status is: Inpatient  Remains inpatient appropriate because: Generalized weakness           Subjective:   C/o general weakness.  Objective:    Vitals:   07/23/21 0040 07/23/21 0555 07/23/21 0814 07/23/21 1208  BP: 136/76 (!) 141/87 (!) 144/78 126/69  Pulse: 73 75 74 73  Resp: 16 16 18 17   Temp:  99.5 F (37.5 C) 99.8 F (37.7 C) 99.5 F (37.5 C) 98.4 F (36.9 C)  TempSrc: Oral Oral    SpO2: 97% 96% 97% 100%  Weight:      Height:       No data found.   Intake/Output Summary (Last 24 hours) at 07/23/2021 1231 Last data filed at 07/23/2021 1042 Gross per 24 hour  Intake 360 ml  Output 300 ml  Net 60 ml   Filed Weights   07/20/21 1412 07/22/21 2110  Weight: 86.2 kg 133.8 kg    Exam:  GEN: NAD SKIN: Warm and dry EYES: No pallor or icterus ENT: MMM CV: RRR PULM: CTA B ABD: soft, ND, NT, +BS CNS: AAO x 3, non focal EXT: Chronic b/l leg edema and hyperpigmented skin changes on b/l legs       Data Reviewed:   I have personally reviewed following labs and imaging studies:  Labs: Labs show the following:   Basic Metabolic Panel: Recent Labs  Lab 07/20/21 1416 07/20/21 1609 07/21/21 0550 07/22/21 0532  NA 138  --  136 137  K 2.7*  --  3.4* 3.6  CL 96*  --  100 100  CO2 30  --  29 29  GLUCOSE 272*  --  217* 124*  BUN 12  --  12 12  CREATININE 0.82  --  0.79 0.72  CALCIUM 8.1*  --  7.6* 7.8*  MG  --  1.5* 1.8  --    GFR Estimated Creatinine Clearance: 113.1 mL/min (by C-G formula based on SCr of 0.72 mg/dL). Liver Function Tests: Recent Labs  Lab 07/20/21 1416  AST 41  ALT 17  ALKPHOS 128*  BILITOT 1.6*  PROT 6.8  ALBUMIN 2.7*   No results for input(s): LIPASE, AMYLASE in the last 168 hours. No results for input(s): AMMONIA in the last 168 hours. Coagulation profile No results for input(s): INR, PROTIME in the last 168 hours.  CBC: Recent Labs  Lab 07/20/21 1416 07/21/21 0550  WBC 10.5 8.4  NEUTROABS 8.9*  --   HGB 11.5* 10.1*  HCT 34.8* 30.1*  MCV 86.4 87.5  PLT 283 231   Cardiac Enzymes: Recent Labs  Lab 07/20/21 1416 07/21/21 0858  CKTOTAL 942* 967*   BNP (last 3 results) No results for input(s): PROBNP in the last 8760 hours. CBG: Recent Labs  Lab 07/21/21 1838 07/22/21 0920 07/22/21 1716  GLUCAP 266* 122*  142*   D-Dimer: No results for input(s): DDIMER in the last 72 hours. Hgb A1c: No results for input(s): HGBA1C in the last 72 hours. Lipid Profile: No results for input(s): CHOL, HDL, LDLCALC, TRIG, CHOLHDL, LDLDIRECT in the last 72 hours. Thyroid function studies: No  results for input(s): TSH, T4TOTAL, T3FREE, THYROIDAB in the last 72 hours.  Invalid input(s): FREET3 Anemia work up: Recent Labs    07/21/21 0550 07/21/21 1835  VITAMINB12  --  6,343*  FOLATE 10.5  --   FERRITIN 179  --   TIBC 202*  --   IRON 19*  --    Sepsis Labs: Recent Labs  Lab 07/20/21 1416 07/20/21 1609 07/20/21 1719 07/21/21 0550  WBC 10.5  --   --  8.4  LATICACIDVEN  --  1.4 1.4  --     Microbiology Recent Results (from the past 240 hour(s))  Resp Panel by RT-PCR (Flu A&B, Covid) Nasopharyngeal Swab     Status: None   Collection Time: 07/20/21  2:16 PM   Specimen: Nasopharyngeal Swab; Nasopharyngeal(NP) swabs in vial transport medium  Result Value Ref Range Status   SARS Coronavirus 2 by RT PCR NEGATIVE NEGATIVE Final    Comment: (NOTE) SARS-CoV-2 target nucleic acids are NOT DETECTED.  The SARS-CoV-2 RNA is generally detectable in upper respiratory specimens during the acute phase of infection. The lowest concentration of SARS-CoV-2 viral copies this assay can detect is 138 copies/mL. A negative result does not preclude SARS-Cov-2 infection and should not be used as the sole basis for treatment or other patient management decisions. A negative result may occur with  improper specimen collection/handling, submission of specimen other than nasopharyngeal swab, presence of viral mutation(s) within the areas targeted by this assay, and inadequate number of viral copies(<138 copies/mL). A negative result must be combined with clinical observations, patient history, and epidemiological information. The expected result is Negative.  Fact Sheet for Patients:   EntrepreneurPulse.com.au  Fact Sheet for Healthcare Providers:  IncredibleEmployment.be  This test is no t yet approved or cleared by the [place] FDA and  has been authorized for detection and/or diagnosis of SARS-CoV-2 by FDA under an Emergency Use Authorization (EUA). This EUA will remain  in effect (meaning this test can be used) for the duration of the COVID-19 declaration under Section 564(b)(1) of the Act, 21 U.S.C.section 360bbb-3(b)(1), unless the authorization is terminated  or revoked sooner.       Influenza A by PCR NEGATIVE NEGATIVE Final   Influenza B by PCR NEGATIVE NEGATIVE Final    Comment: (NOTE) The Xpert Xpress SARS-CoV-2/FLU/RSV plus assay is intended as an aid in the diagnosis of influenza from Nasopharyngeal swab specimens and should not be used as a sole basis for treatment. Nasal washings and aspirates are unacceptable for Xpert Xpress SARS-CoV-2/FLU/RSV testing.  Fact Sheet for Patients: EntrepreneurPulse.com.au  Fact Sheet for Healthcare Providers: IncredibleEmployment.be  This test is not yet approved or cleared by the [place] FDA and has been authorized for detection and/or diagnosis of SARS-CoV-2 by FDA under an Emergency Use Authorization (EUA). This EUA will remain in effect (meaning this test can be used) for the duration of the COVID-19 declaration under Section 564(b)(1) of the Act, 21 U.S.C. section 360bbb-3(b)(1), unless the authorization is terminated or revoked.  Performed at [facility], [address] [place], [place] [address]   Blood culture (routine x 2)     Status: None (Preliminary result)   Collection Time: 07/20/21  4:09 PM   Specimen: BLOOD  Result Value Ref Range Status   Specimen Description BLOOD BLOOD RIGHT HAND  Final   Special Requests   Final    BOTTLES DRAWN AEROBIC AND ANAEROBIC Blood Culture adequate volume   Culture    Final  NO GROWTH 3 DAYS Performed at [facility], [address] [place], [place] [address]    Report Status PENDING  Incomplete  Blood culture (routine x 2)     Status: None (Preliminary result)   Collection Time: 07/20/21  4:15 PM   Specimen: BLOOD  Result Value Ref Range Status   Specimen Description BLOOD RIGHT ANTECUBITAL  Final   Special Requests   Final    BOTTLES DRAWN AEROBIC AND ANAEROBIC Blood Culture adequate volume   Culture   Final    NO GROWTH 3 DAYS Performed at [facility], [address], [place],  [address]    Report Status PENDING  Incomplete    Procedures and diagnostic studies:  No results found.             LOS: 3 days   Racquelle Hyser  [facility]   Pager on [email or web address]. If 7PM-7AM, please contact night-coverage at [email or web address]     07/23/2021, 12:31 PM

## 2021-07-24 DIAGNOSIS — W19XXXA Unspecified fall, initial encounter: Secondary | ICD-10-CM | POA: Diagnosis not present

## 2021-07-24 DIAGNOSIS — R531 Weakness: Secondary | ICD-10-CM | POA: Diagnosis not present

## 2021-07-24 DIAGNOSIS — L89303 Pressure ulcer of unspecified buttock, stage 3: Secondary | ICD-10-CM | POA: Diagnosis not present

## 2021-07-24 DIAGNOSIS — Y92009 Unspecified place in unspecified non-institutional (private) residence as the place of occurrence of the external cause: Secondary | ICD-10-CM | POA: Diagnosis not present

## 2021-07-24 LAB — GLUCOSE, CAPILLARY
Glucose-Capillary: 60 mg/dL — ABNORMAL LOW (ref 70–99)
Glucose-Capillary: 74 mg/dL (ref 70–99)
Glucose-Capillary: 114 mg/dL — ABNORMAL HIGH (ref 70–99)
Glucose-Capillary: 93 mg/dL (ref 70–99)
Glucose-Capillary: 141 mg/dL — ABNORMAL HIGH (ref 70–99)

## 2021-07-24 MED ORDER — INSULIN ASPART PROT & ASPART (70-30 MIX) 100 UNIT/ML ~~LOC~~ SUSP
12.0000 [IU] | Freq: Two times a day (BID) | SUBCUTANEOUS | Status: DC
  Filled 2021-07-24: qty 10

## 2021-07-24 MED ORDER — INSULIN ASPART 100 UNIT/ML IJ SOLN
0.0000 [IU] | Freq: Three times a day (TID) | INTRAMUSCULAR | Status: DC
  Administered 2021-07-25: 12:00:00 2 [IU] via SUBCUTANEOUS
  Administered 2021-07-25 – 2021-07-26 (×3): 1 [IU] via SUBCUTANEOUS
  Administered 2021-07-27 (×2): 2 [IU] via SUBCUTANEOUS
  Administered 2021-07-27: 1 [IU] via SUBCUTANEOUS
  Administered 2021-07-28: 2 [IU] via SUBCUTANEOUS
  Administered 2021-07-29 – 2021-07-30 (×4): 1 [IU] via SUBCUTANEOUS
  Administered 2021-07-30: 09:00:00 2 [IU] via SUBCUTANEOUS
  Administered 2021-08-01 (×2): 1 [IU] via SUBCUTANEOUS
  Administered 2021-08-02: 3 [IU] via SUBCUTANEOUS
  Filled 2021-07-24 (×17): qty 1

## 2021-07-24 NOTE — Progress Notes (Signed)
Progress Note    Christopher Collier  EXN:[id number] DOB: 11/18/1945  DOA: 07/20/2021 PCP: Orpah Greek, DO      Brief Narrative:    Medical records reviewed and are as summarized below:  Christopher Collier is a 75 y.o. male with medical history significant for prostate cancer, hypertension, insulin-dependent diabetes mellitus, CAD, OSA, history of recurrent DVT, chronic anemia, who was brought to the hospital because of generalized weakness.  He had been laying on the floor since Monday, 07/18/2021 and had been unable to get up by himself.  He was admitted to the hospital for generalized weakness, hypomagnesemia and hypokalemia.  He was also found to have stage III decubitus ulcers on bilateral buttocks and deep tissue injury on the sacrum.    Assessment/Plan:   Principal Problem:   Generalized weakness Active Problems:   Diabetes mellitus without complication (HCC)   Hypertension   GERD (gastroesophageal reflux disease)   Fall at home, initial encounter   Hypokalemia   Rhabdomyolysis   Decubitus ulcer of buttock, stage 3 (HCC)   Body mass index is 41.14 kg/m.  Generalized weakness/fall at home: Awaiting placement to SNF.  Continue fall precautions.  Hypokalemia and hypomagnesemia: Improved  Iron deficiency anemia: Continue Ferrous sulfate. 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  B12 level is elevated at 6,343.  Insulin-dependent diabetes mellitus with hyperglycemia, hypoglycemia: He had hypoglycemia with blood sugar of 60 on the morning of 07/24/2021.  Hold morning dose of insulin.  Decrease NovoLog mix from 15 units twice daily to 12 units twice daily.    Stage III decubitus ulcers on bilateral buttocks, deep tissue injury on the sacrum: Continue local wound care.    Mildly elevated CK level (942, 967) : CK level stable.   History of  recurrent DVT: Continue Eliquis  History of prostate cancer: Continue Casodex  Minimally displaced right distal clavicular fracture (06/28/2021): Outpatient follow-up with orthopedic surgeon.   Diet Order             Diet 2 gram sodium Room service appropriate? Yes; Fluid consistency: Thin  Diet effective now                      Consultants: None  Procedures: None    Medications:    apixaban  2.5 mg Oral BID   bicalutamide  50 mg Oral Daily   ferrous sulfate  325 mg Oral QODAY   insulin aspart  0-9 Units Subcutaneous TID WC   insulin aspart protamine- aspart  15 Units Subcutaneous BID WC   liver oil-zinc oxide   Topical TID   metoprolol tartrate  25 mg Oral QAC breakfast   oxybutynin  10 mg Oral QHS   pantoprazole  40 mg Oral Daily   Continuous Infusions:     Anti-infectives (From admission, onward)    Start     Dose/Rate Route Frequency Ordered Stop   07/21/21 1600  cefTRIAXone (ROCEPHIN) 2 g in sodium chloride 0.9 % 100 mL IVPB  Status:  Discontinued        2 g 200 mL/hr over 30 Minutes Intravenous Every 24 hours 07/20/21 1612 07/21/21 1552   07/20/21 1545  vancomycin (VANCOREADY) IVPB 2000 mg/400 mL        2,000 mg 200 mL/hr over 120 Minutes Intravenous  Once 07/20/21 1533 07/20/21 1934   07/20/21 1530  cefTRIAXone (ROCEPHIN) 2  g in sodium chloride 0.9 % 100 mL IVPB        2 g 200 mL/hr over 30 Minutes Intravenous  Once 07/20/21 1529 07/20/21 1718              Family Communication/Anticipated D/C date and plan/Code Status   DVT prophylaxis: apixaban (ELIQUIS) tablet 2.5 mg Start: 07/21/21 2200 apixaban (ELIQUIS) tablet 2.5 mg     Code Status: Full Code  Family Communication: None Disposition Plan: Plan to discharge to home versus SNF   Status is: Inpatient  Remains inpatient appropriate because: Generalized weakness           Subjective:   No acute events overnight.  He feels weak otherwise he has no  complaints.  Objective:    Vitals:   07/23/21 1609 07/23/21 2000 07/24/21 0628 07/24/21 0810  BP: 134/66 133/74 126/67 139/77  Pulse: 71 72 68 69  Resp: 17 18 18 17   Temp: 98.9 F (37.2 C) 99.3 F (37.4 C) 98.8 F (37.1 C) 98.4 F (36.9 C)  TempSrc:      SpO2: 92% 98% 96% 97%  Weight:      Height:       No data found.   Intake/Output Summary (Last 24 hours) at 07/24/2021 1124 Last data filed at 07/24/2021 1047 Gross per 24 hour  Intake 480 ml  Output 1401 ml  Net -921 ml   Filed Weights   07/20/21 1412 07/22/21 2110  Weight: 86.2 kg 133.8 kg    Exam:  GEN: NAD SKIN: Stage III decubitus ulcers on bilateral buttocks, deep tissue injury on sacrum EYES: EOMI ENT: MMM CV: RRR PULM: CTA B ABD: soft, ND, NT, +BS CNS: AAO x 3, non focal EXT: No edema or tenderness        Data Reviewed:   I have personally reviewed following labs and imaging studies:  Labs: Labs show the following:   Basic Metabolic Panel: Recent Labs  Lab 07/20/21 1416 07/20/21 1609 07/21/21 0550 07/22/21 0532  NA 138  --  136 137  K 2.7*  --  3.4* 3.6  CL 96*  --  100 100  CO2 30  --  29 29  GLUCOSE 272*  --  217* 124*  BUN 12  --  12 12  CREATININE 0.82  --  0.79 0.72  CALCIUM 8.1*  --  7.6* 7.8*  MG  --  1.5* 1.8  --    GFR Estimated Creatinine Clearance: 113.1 mL/min (by C-G formula based on SCr of 0.72 mg/dL). Liver Function Tests: Recent Labs  Lab 07/20/21 1416  AST 41  ALT 17  ALKPHOS 128*  BILITOT 1.6*  PROT 6.8  ALBUMIN 2.7*   No results for input(s): LIPASE, AMYLASE in the last 168 hours. No results for input(s): AMMONIA in the last 168 hours. Coagulation profile No results for input(s): INR, PROTIME in the last 168 hours.  CBC: Recent Labs  Lab 07/20/21 1416 07/21/21 0550  WBC 10.5 8.4  NEUTROABS 8.9*  --   HGB 11.5* 10.1*  HCT 34.8* 30.1*  MCV 86.4 87.5  PLT 283 231   Cardiac Enzymes: Recent Labs  Lab 07/20/21 1416 07/21/21 0858   CKTOTAL 942* 967*   BNP (last 3 results) No results for input(s): PROBNP in the last 8760 hours. CBG: Recent Labs  Lab 07/22/21 0920 07/22/21 1716 07/23/21 2003 07/24/21 0821 07/24/21 0852  GLUCAP 122* 142* 93 60* 74   D-Dimer: No results for input(s): DDIMER in the last  72 hours. Hgb A1c: No results for input(s): HGBA1C in the last 72 hours. Lipid Profile: No results for input(s): CHOL, HDL, LDLCALC, TRIG, CHOLHDL, LDLDIRECT in the last 72 hours. Thyroid function studies: No results for input(s): TSH, T4TOTAL, T3FREE, THYROIDAB in the last 72 hours.  Invalid input(s): FREET3 Anemia work up: Recent Labs    07/21/21 [facility]*   Sepsis Labs: Recent Labs  Lab 07/20/21 1416 07/20/21 1609 07/20/21 1719 07/21/21 0550  WBC 10.5  --   --  8.4  LATICACIDVEN  --  1.4 1.4  --     Microbiology Recent Results (from the past 240 hour(s))  Resp Panel by RT-PCR (Flu A&B, Covid) Nasopharyngeal Swab     Status: None   Collection Time: 07/20/21  2:16 PM   Specimen: Nasopharyngeal Swab; Nasopharyngeal(NP) swabs in vial transport medium  Result Value Ref Range Status   SARS Coronavirus 2 by RT PCR NEGATIVE NEGATIVE Final    Comment: (NOTE) SARS-CoV-2 target nucleic acids are NOT DETECTED.  The SARS-CoV-2 RNA is generally detectable in upper respiratory specimens during the acute phase of infection. The lowest concentration of SARS-CoV-2 viral copies this assay can detect is 138 copies/mL. A negative result does not preclude SARS-Cov-2 infection and should not be used as the sole basis for treatment or other patient management decisions. A negative result may occur with  improper specimen collection/handling, submission of specimen other than nasopharyngeal swab, presence of viral mutation(s) within the areas targeted by this assay, and inadequate number of viral copies(<138 copies/mL). A negative result must be combined with clinical observations, patient  history, and epidemiological information. The expected result is Negative.  Fact Sheet for Patients:  EntrepreneurPulse.com.au  Fact Sheet for Healthcare Providers:  IncredibleEmployment.be  This test is no t yet approved or cleared by the [place] FDA and  has been authorized for detection and/or diagnosis of SARS-CoV-2 by FDA under an Emergency Use Authorization (EUA). This EUA will remain  in effect (meaning this test can be used) for the duration of the COVID-19 declaration under Section 564(b)(1) of the Act, 21 U.S.C.section 360bbb-3(b)(1), unless the authorization is terminated  or revoked sooner.       Influenza A by PCR NEGATIVE NEGATIVE Final   Influenza B by PCR NEGATIVE NEGATIVE Final    Comment: (NOTE) The Xpert Xpress SARS-CoV-2/FLU/RSV plus assay is intended as an aid in the diagnosis of influenza from Nasopharyngeal swab specimens and should not be used as a sole basis for treatment. Nasal washings and aspirates are unacceptable for Xpert Xpress SARS-CoV-2/FLU/RSV testing.  Fact Sheet for Patients: EntrepreneurPulse.com.au  Fact Sheet for Healthcare Providers: IncredibleEmployment.be  This test is not yet approved or cleared by the [place] FDA and has been authorized for detection and/or diagnosis of SARS-CoV-2 by FDA under an Emergency Use Authorization (EUA). This EUA will remain in effect (meaning this test can be used) for the duration of the COVID-19 declaration under Section 564(b)(1) of the Act, 21 U.S.C. section 360bbb-3(b)(1), unless the authorization is terminated or revoked.  Performed at [facility], [address] [place], [place] [address]   Blood culture (routine x 2)     Status: None (Preliminary result)   Collection Time: 07/20/21  4:09 PM   Specimen: BLOOD  Result Value Ref Range Status   Specimen Description BLOOD BLOOD RIGHT HAND  Final    Special Requests   Final    BOTTLES DRAWN AEROBIC AND ANAEROBIC Blood Culture adequate volume  Culture   Final    NO GROWTH 4 DAYS Performed at [facility], [address] [place], [place] [address]    Report Status PENDING  Incomplete  Blood culture (routine x 2)     Status: None (Preliminary result)   Collection Time: 07/20/21  4:15 PM   Specimen: BLOOD  Result Value Ref Range Status   Specimen Description BLOOD RIGHT ANTECUBITAL  Final   Special Requests   Final    BOTTLES DRAWN AEROBIC AND ANAEROBIC Blood Culture adequate volume   Culture   Final    NO GROWTH 4 DAYS Performed at [facility], [address] [place], [place] [address]    Report Status PENDING  Incomplete    Procedures and diagnostic studies:  No results found.             LOS: 4 days   Willaim Mode  [facility]   Pager on [email or web address]. If 7PM-7AM, please contact night-coverage at [email or web address]     07/24/2021, 11:24 AM

## 2021-07-25 DIAGNOSIS — R531 Weakness: Secondary | ICD-10-CM | POA: Diagnosis not present

## 2021-07-25 DIAGNOSIS — W19XXXA Unspecified fall, initial encounter: Secondary | ICD-10-CM | POA: Diagnosis not present

## 2021-07-25 DIAGNOSIS — Y92009 Unspecified place in unspecified non-institutional (private) residence as the place of occurrence of the external cause: Secondary | ICD-10-CM | POA: Diagnosis not present

## 2021-07-25 LAB — CULTURE, BLOOD (ROUTINE X 2)
Culture: NO GROWTH
Culture: NO GROWTH
Special Requests: ADEQUATE
Special Requests: ADEQUATE

## 2021-07-25 LAB — GLUCOSE, CAPILLARY
Glucose-Capillary: 145 mg/dL — ABNORMAL HIGH (ref 70–99)
Glucose-Capillary: 161 mg/dL — ABNORMAL HIGH (ref 70–99)
Glucose-Capillary: 131 mg/dL — ABNORMAL HIGH (ref 70–99)
Glucose-Capillary: 109 mg/dL — ABNORMAL HIGH (ref 70–99)

## 2021-07-25 MED ORDER — INSULIN ASPART PROT & ASPART (70-30 MIX) 100 UNIT/ML ~~LOC~~ SUSP
10.0000 [IU] | Freq: Two times a day (BID) | SUBCUTANEOUS | Status: DC
  Administered 2021-07-25 (×2): 10 [IU] via SUBCUTANEOUS
  Filled 2021-07-25: qty 10

## 2021-07-25 NOTE — Progress Notes (Signed)
Physical Therapy Treatment Patient Details Name: Christopher Collier MRN: [id number] DOB: 09-18-1946 Today's Date: 07/25/2021   History of Present Illness Pt is a 75 y.o. male presenting to hospital 10/26 with generalized weakness (pt laid on floor on Monday and unable to get up since then).  Pt addmited with generalized weaknes/possible fall? (per chart pt fell 3 weeks ago and has R clavicular fx 06/28/21), hypokalemia, DM, and stage III decubitus ulcer with surrounding cellulitis.  PMH includes incontinence, DM, DVT, htn, MRSA, sleep apnea, UTI, and h/o prostate CA.    PT Comments    Pt seated EOB beginning of session working with OT. PT, OT co-treat this session to provide physical assist necessary for sit > stand. MAX A + 2 for transfers due to BLE weakness. Attempted bed > recliner transfer w/ MAX-A + 2 HHA but pt largely unable to shuffle feet without significant assist and cues for weight shifting. Pt attempts good effort and motivation for therapy, but lacks functional ability to maintain independence with OOB activities. SNF continues to be primary recommendation at discharge. Skilled PT intervention is indicated to address deficits in function, mobility, and to return to PLOF as able.     Recommendations for follow up therapy are one component of a multi-disciplinary discharge planning process, led by the attending physician.  Recommendations may be updated based on patient status, additional functional criteria and insurance authorization.  Follow Up Recommendations  Skilled nursing-short term rehab (<3 hours/day)     Assistance Recommended at Discharge Frequent or constant Supervision/Assistance  Equipment Recommendations  Other (comment) (TBD next venue of care)    Recommendations for Other Services       Precautions / Restrictions Precautions Precautions: Fall Restrictions Weight Bearing Restrictions: Yes Other Position/Activity Restrictions: Per Dr. Steva Colder ([place]) note 07/18/21 (orthopaedic f/u visit): "I encouraged him to begin gently actively using his right upper extremity as tolerated."     Mobility  Bed Mobility Overal bed mobility: Needs Assistance Bed Mobility: Sit to Supine       Sit to supine: Max assist   General bed mobility comments: heavy assist for t runk and BLE, pt cued to utilize BLE and reaching for bed rails to scoot to [place] Overall transfer level: Needs assistance Equipment used: 2 person hand held assist Transfers: Sit to/from [facility] Sit to Stand: Max assist;+2 physical assistance;From elevated surface Stand pivot transfers: Max assist         General transfer comment: STS x 3 from elevated bed height and recliner; pt unable to slide feet without heavy assist and physical weight shifting    Ambulation/Gait             General Gait Details: Stand pivot transfer pt largely unable to mobilize LEs and shuffle feet despite physical assist   Stairs             Wheelchair Mobility    Modified Rankin (Stroke Patients Only)       Balance Overall balance assessment: Needs assistance Sitting-balance support: Feet supported;Bilateral upper extremity supported Sitting balance-Leahy Scale: Fair Sitting balance - Comments: SUPV for static activites, able to lift arms for short periods of time     Standing balance-Leahy Scale: Poor                              Cognition Arousal/Alertness: Awake/alert Behavior During Therapy: WFL for tasks assessed/performed Overall  Cognitive Status: Within Functional Limits for tasks assessed                                          Exercises      General Comments        Pertinent Vitals/Pain Pain Assessment: Faces Faces Pain Scale: Hurts a little bit Pain Location: R knee, buttock Pain Descriptors / Indicators: Aching;Discomfort Pain Intervention(s): Limited activity within  patient's tolerance;Monitored during session    Home Living                          Prior Function            PT Goals (current goals can now be found in the care plan section) Progress towards PT goals: Progressing toward goals    Frequency    Min 2X/week      PT Plan Current plan remains appropriate    Co-evaluation PT/OT/SLP Co-Evaluation/Treatment: Yes Reason for Co-Treatment: For patient/therapist safety;To address functional/ADL transfers PT goals addressed during session: Mobility/safety with mobility OT goals addressed during session: ADL's and self-care      AM-PAC PT "6 Clicks" Mobility   Outcome Measure  Help needed turning from your back to your side while in a flat bed without using bedrails?: A Lot Help needed moving from lying on your back to sitting on the side of a flat bed without using bedrails?: A Lot Help needed moving to and from a bed to a chair (including a wheelchair)?: A Lot Help needed standing up from a chair using your arms (e.g., wheelchair or bedside chair)?: A Lot Help needed to walk in hospital room?: Total Help needed climbing 3-5 steps with a railing? : Total 6 Click Score: 10    End of Session Equipment Utilized During Treatment: Gait belt Activity Tolerance: Patient tolerated treatment well Patient left: in bed;with call bell/phone within reach;with bed alarm set (prevalon boots)   PT Visit Diagnosis: Other abnormalities of gait and mobility (R26.89);Muscle weakness (generalized) (M62.81);History of falling (Z91.81);Pain     Time: 1540-0867 PT Time Calculation (min) (ACUTE ONLY): 27 min  Charges:                        [employer], SPT

## 2021-07-25 NOTE — Progress Notes (Addendum)
Inpatient Diabetes Program Recommendations  AACE/ADA: New Consensus Statement on Inpatient Glycemic Control (2015)  Target Ranges:  Prepandial:   less than 140 mg/dL      Peak postprandial:   less than 180 mg/dL (1-2 hours)      Critically ill patients:  140 - 180 mg/dL  Results for JAWAD, WIACEK (MRN [id number]) as of 07/25/2021 12:04  Ref. Range 07/24/2021 08:21 07/24/2021 08:52 07/24/2021 12:12 07/24/2021 15:58 07/24/2021 22:20  Glucose-Capillary Latest Ref Range: 70 - 99 mg/dL  Received 70/30 Insulin 15 units BID on 10/29 at 10:31am and again at 6:10pm 60 (L) 74 114 (H) 93 141 (H)  Results for ARAF, CLUGSTON (MRN [id number]) as of 07/25/2021 12:04  Ref. Range 07/25/2021 07:40 07/25/2021 11:46  Glucose-Capillary Latest Ref Range: 70 - 99 mg/dL 145 (H)  1 unit  Novolog  10 units 70/30 Insulin  161 (H)     Admit with: Weakness/ Fall (found on floor for 3 days)  History: DM  Home DM Meds: Novolog 70/30 Insulin 30 units AM/ 15 units Afternoon/ 30 units PM       Ran out of Insulin 1 month ago (cost issues per Home Med [facility])  Current Orders: Novolog 70/30 Insulin 10 units BID      Novolog Sensitive Correction Scale/ SSI (0-9 units) TID AC      Note Hypoglycemia yesterday AM  70/30 Insulin was HELD all day yesterday 10/30  70/30 Insulin doses reduced for today--agree with reduction   Note consult for cost of the 70/30 home insulin.  Unsure how much the Novolog 70/30 Insulin costs out of pocket for patient.  Patient can be switched to Novolin Reli-On Brand 70/30 Insulin which can be purchased at [facility] out of pocket for $25 per vial or $43 for 5 pack of insulin pens.  Pt can purchase either the pens or vials out of pocket for low cost without filing with his insurance.  Novolin Reli-On 70/30 Insulin Pens- Order # [id number]  Novolin Reli-On 70/30 Insulin Faith Rogue- Order # [id number]    --Will follow patient during hospitalization--  Wyn Quaker RN, MSN,  CDE Diabetes Coordinator Inpatient Glycemic Control Team Team Pager: [phone] (8a-5p)

## 2021-07-25 NOTE — Progress Notes (Signed)
Progress Note    Christopher Collier  PNT:[id number] DOB: 15-Mar-1946  DOA: 07/20/2021 PCP: Orpah Greek, DO      Brief Narrative:    Medical records reviewed and are as summarized below:  Christopher Collier is a 75 y.o. male with medical history significant for prostate cancer, hypertension, insulin-dependent diabetes mellitus, CAD, OSA, history of recurrent DVT, chronic anemia, who was brought to the hospital because of generalized weakness.  He had been laying on the floor since Monday, 07/18/2021 and had been unable to get up by himself.  He was admitted to the hospital for generalized weakness, hypomagnesemia and hypokalemia.  He was also found to have stage III decubitus ulcers on bilateral buttocks and deep tissue injury on the sacrum.    Assessment/Plan:   Principal Problem:   Generalized weakness Active Problems:   Diabetes mellitus without complication (HCC)   Hypertension   GERD (gastroesophageal reflux disease)   Fall at home, initial encounter   Hypokalemia   Rhabdomyolysis   Decubitus ulcer of buttock, stage 3 (HCC)   Body mass index is 41.14 kg/m.  Generalized weakness/fall at home: Awaiting placement to SNF.  Continue fall precautions.   Hypokalemia and hypomagnesemia: Improved  Iron deficiency anemia: Continue Ferrous sulfate. 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  B12 level is elevated at 6,343.  Insulin-dependent diabetes mellitus with hyperglycemia, hypoglycemia: He had hypoglycemia with blood sugar of 60 on the morning of 07/24/2021.  Decrease NovoLog mix from 12 units twice daily to 10 units twice daily.  Stage III decubitus ulcers on bilateral buttocks, deep tissue injury on the sacrum: Continue local wound care.    Mildly elevated CK level (942, 967) : CK level stable.   History of recurrent DVT: Continue  Eliquis  History of prostate cancer: Continue Casodex  Minimally displaced right distal clavicular fracture (06/28/2021): Outpatient follow-up with orthopedic surgeon.   Diet Order             Diet 2 gram sodium Room service appropriate? Yes; Fluid consistency: Thin  Diet effective now                      Consultants: None  Procedures: None    Medications:    apixaban  2.5 mg Oral BID   bicalutamide  50 mg Oral Daily   ferrous sulfate  325 mg Oral QODAY   insulin aspart  0-9 Units Subcutaneous TID WC   insulin aspart protamine- aspart  10 Units Subcutaneous BID WC   liver oil-zinc oxide   Topical TID   metoprolol tartrate  25 mg Oral QAC breakfast   oxybutynin  10 mg Oral QHS   pantoprazole  40 mg Oral Daily   Continuous Infusions:     Anti-infectives (From admission, onward)    Start     Dose/Rate Route Frequency Ordered Stop   07/21/21 1600  cefTRIAXone (ROCEPHIN) 2 g in sodium chloride 0.9 % 100 mL IVPB  Status:  Discontinued        2 g 200 mL/hr over 30 Minutes Intravenous Every 24 hours 07/20/21 1612 07/21/21 1552   07/20/21 1545  vancomycin (VANCOREADY) IVPB 2000 mg/400 mL        2,000 mg 200 mL/hr over 120 Minutes Intravenous  Once 07/20/21 1533 07/20/21 1934   07/20/21 1530  cefTRIAXone (ROCEPHIN) 2 g in sodium chloride 0.9 % 100  mL IVPB        2 g 200 mL/hr over 30 Minutes Intravenous  Once 07/20/21 1529 07/20/21 1718              Family Communication/Anticipated D/C date and plan/Code Status   DVT prophylaxis: apixaban (ELIQUIS) tablet 2.5 mg Start: 07/21/21 2200 apixaban (ELIQUIS) tablet 2.5 mg     Code Status: Full Code  Family Communication: None Disposition Plan: Plan to discharge to home versus SNF   Status is: Inpatient  Remains inpatient appropriate because: Generalized weakness           Subjective:   Interval events noted.  His insulin was held yesterday because of concerns of low normal blood glucose  levels.  Objective:    Vitals:   07/24/21 1556 07/24/21 2219 07/25/21 0611 07/25/21 0802  BP: 131/70 (!) 142/72 134/71 (!) 141/74  Pulse: 67 70 68 69  Resp: 17 16 16 18   Temp: 99.2 F (37.3 C) 98.7 F (37.1 C) 98.9 F (37.2 C) 98.5 F (36.9 C)  TempSrc:  Oral Oral Oral  SpO2: 100% 96% 98% 97%  Weight:      Height:       No data found.   Intake/Output Summary (Last 24 hours) at 07/25/2021 1335 Last data filed at 07/25/2021 1150 Gross per 24 hour  Intake 660 ml  Output 400 ml  Net 260 ml   Filed Weights   07/20/21 1412 07/22/21 2110  Weight: 86.2 kg 133.8 kg    Exam:  GEN: NAD SKIN: Stage III decubitus ulcers on bilateral buttocks, deep tissue injury on sacrum EYES: EOMI ENT: MMM CV: RRR PULM: CTA B ABD: soft, ND, NT, +BS CNS: AAO x 3, non focal EXT: No edema or tenderness         Data Reviewed:   I have personally reviewed following labs and imaging studies:  Labs: Labs show the following:   Basic Metabolic Panel: Recent Labs  Lab 07/20/21 1416 07/20/21 1609 07/21/21 0550 07/22/21 0532  NA 138  --  136 137  K 2.7*  --  3.4* 3.6  CL 96*  --  100 100  CO2 30  --  29 29  GLUCOSE 272*  --  217* 124*  BUN 12  --  12 12  CREATININE 0.82  --  0.79 0.72  CALCIUM 8.1*  --  7.6* 7.8*  MG  --  1.5* 1.8  --    GFR Estimated Creatinine Clearance: 113.1 mL/min (by C-G formula based on SCr of 0.72 mg/dL). Liver Function Tests: Recent Labs  Lab 07/20/21 1416  AST 41  ALT 17  ALKPHOS 128*  BILITOT 1.6*  PROT 6.8  ALBUMIN 2.7*   No results for input(s): LIPASE, AMYLASE in the last 168 hours. No results for input(s): AMMONIA in the last 168 hours. Coagulation profile No results for input(s): INR, PROTIME in the last 168 hours.  CBC: Recent Labs  Lab 07/20/21 1416 07/21/21 0550  WBC 10.5 8.4  NEUTROABS 8.9*  --   HGB 11.5* 10.1*  HCT 34.8* 30.1*  MCV 86.4 87.5  PLT 283 231   Cardiac Enzymes: Recent Labs  Lab 07/20/21 1416  07/21/21 0858  CKTOTAL 942* 967*   BNP (last 3 results) No results for input(s): PROBNP in the last 8760 hours. CBG: Recent Labs  Lab 07/24/21 1212 07/24/21 1558 07/24/21 2220 07/25/21 0740 07/25/21 1146  GLUCAP 114* 93 141* 145* 161*   D-Dimer: No results for input(s): DDIMER in the  last 72 hours. Hgb A1c: No results for input(s): HGBA1C in the last 72 hours. Lipid Profile: No results for input(s): CHOL, HDL, LDLCALC, TRIG, CHOLHDL, LDLDIRECT in the last 72 hours. Thyroid function studies: No results for input(s): TSH, T4TOTAL, T3FREE, THYROIDAB in the last 72 hours.  Invalid input(s): FREET3 Anemia work up: No results for input(s): VITAMINB12, FOLATE, FERRITIN, TIBC, IRON, RETICCTPCT in the last 72 hours.  Sepsis Labs: Recent Labs  Lab 07/20/21 1416 07/20/21 1609 07/20/21 1719 07/21/21 0550  WBC 10.5  --   --  8.4  LATICACIDVEN  --  1.4 1.4  --     Microbiology Recent Results (from the past 240 hour(s))  Resp Panel by RT-PCR (Flu A&B, Covid) Nasopharyngeal Swab     Status: None   Collection Time: 07/20/21  2:16 PM   Specimen: Nasopharyngeal Swab; Nasopharyngeal(NP) swabs in vial transport medium  Result Value Ref Range Status   SARS Coronavirus 2 by RT PCR NEGATIVE NEGATIVE Final    Comment: (NOTE) SARS-CoV-2 target nucleic acids are NOT DETECTED.  The SARS-CoV-2 RNA is generally detectable in upper respiratory specimens during the acute phase of infection. The lowest concentration of SARS-CoV-2 viral copies this assay can detect is 138 copies/mL. A negative result does not preclude SARS-Cov-2 infection and should not be used as the sole basis for treatment or other patient management decisions. A negative result may occur with  improper specimen collection/handling, submission of specimen other than nasopharyngeal swab, presence of viral mutation(s) within the areas targeted by this assay, and inadequate number of viral copies(<138 copies/mL). A negative  result must be combined with clinical observations, patient history, and epidemiological information. The expected result is Negative.  Fact Sheet for Patients:  EntrepreneurPulse.com.au  Fact Sheet for Healthcare Providers:  IncredibleEmployment.be  This test is no t yet approved or cleared by the [place] FDA and  has been authorized for detection and/or diagnosis of SARS-CoV-2 by FDA under an Emergency Use Authorization (EUA). This EUA will remain  in effect (meaning this test can be used) for the duration of the COVID-19 declaration under Section 564(b)(1) of the Act, 21 U.S.C.section 360bbb-3(b)(1), unless the authorization is terminated  or revoked sooner.       Influenza A by PCR NEGATIVE NEGATIVE Final   Influenza B by PCR NEGATIVE NEGATIVE Final    Comment: (NOTE) The Xpert Xpress SARS-CoV-2/FLU/RSV plus assay is intended as an aid in the diagnosis of influenza from Nasopharyngeal swab specimens and should not be used as a sole basis for treatment. Nasal washings and aspirates are unacceptable for Xpert Xpress SARS-CoV-2/FLU/RSV testing.  Fact Sheet for Patients: EntrepreneurPulse.com.au  Fact Sheet for Healthcare Providers: IncredibleEmployment.be  This test is not yet approved or cleared by the [place] FDA and has been authorized for detection and/or diagnosis of SARS-CoV-2 by FDA under an Emergency Use Authorization (EUA). This EUA will remain in effect (meaning this test can be used) for the duration of the COVID-19 declaration under Section 564(b)(1) of the Act, 21 U.S.C. section 360bbb-3(b)(1), unless the authorization is terminated or revoked.  Performed at [facility], [address] [place], [place] [address]   Blood culture (routine x 2)     Status: None   Collection Time: 07/20/21  4:09 PM   Specimen: BLOOD  Result Value Ref Range Status   Specimen  Description BLOOD BLOOD RIGHT HAND  Final   Special Requests   Final    BOTTLES DRAWN AEROBIC AND ANAEROBIC Blood Culture adequate  volume   Culture   Final    NO GROWTH 5 DAYS Performed at [facility], [address] [place], [place] [address]    Report Status 07/25/2021 FINAL  Final  Blood culture (routine x 2)     Status: None   Collection Time: 07/20/21  4:15 PM   Specimen: BLOOD  Result Value Ref Range Status   Specimen Description BLOOD RIGHT ANTECUBITAL  Final   Special Requests   Final    BOTTLES DRAWN AEROBIC AND ANAEROBIC Blood Culture adequate volume   Culture   Final    NO GROWTH 5 DAYS Performed at [facility], [address] [place], [place] [address]    Report Status 07/25/2021 FINAL  Final    Procedures and diagnostic studies:  No results found.             LOS: 5 days   Kaiesha Tonner  [facility]   Pager on [email or web address]. If 7PM-7AM, please contact night-coverage at [email or web address]     07/25/2021, 1:35 PM

## 2021-07-25 NOTE — Progress Notes (Signed)
Occupational Therapy Treatment Patient Details Name: Christopher Collier MRN: [id number] DOB: 08-23-1946 Today's Date: 07/25/2021   History of present illness Pt is a 75 y.o. male presenting to hospital 10/26 with generalized weakness (pt laid on floor on Monday and unable to get up since then).  Pt addmited with generalized weaknes/possible fall? (per chart pt fell 3 weeks ago and has R clavicular fx 06/28/21), hypokalemia, DM, and stage III decubitus ulcer with surrounding cellulitis.  PMH includes incontinence, DM, DVT, htn, MRSA, sleep apnea, UTI, and h/o prostate CA.   OT comments  Chart reviewed, RN cleared pt for participation in OT tx session. Co-tx completed with PT due to physical assist required for mobility. Tx session targeted progressing functional mobility in preparation for increased independence in ADLs. Pt tolerated increased activity as evidenced by performing supine<>seated at edge of bed, transfer to chair however required MAX assist x2 for STS, SPT 2x to chair, then return to bed. Pt is left as received in bed, NAD, all needs met. OT will continue to follow while admitted.    Recommendations for follow up therapy are one component of a multi-disciplinary discharge planning process, led by the attending physician.  Recommendations may be updated based on patient status, additional functional criteria and insurance authorization.    Follow Up Recommendations  Skilled nursing-short term rehab (<3 hours/day)    Assistance Recommended at Discharge    Equipment Recommendations   (defer to next venue of care)    Recommendations for Other Services      Precautions / Restrictions Precautions Precautions: Fall Restrictions Weight Bearing Restrictions: Yes RUE Weight Bearing: Weight bearing as tolerated Other Position/Activity Restrictions: Per Dr. Steva Colder ([place]) note 07/18/21 (orthopaedic f/u visit): "I encouraged him to begin gently actively using his  right upper extremity as tolerated."       Mobility Bed Mobility Overal bed mobility: Needs Assistance Bed Mobility: Sit to Supine     Supine to sit: Min assist Sit to supine: Max assist       Transfers Overall transfer level: Needs assistance Equipment used: 2 person hand held assist Transfers: Sit to/from [facility] Sit to Stand: Max assist;+2 physical assistance;From elevated surface Stand pivot transfers: Max assist            Balance Overall balance assessment: Needs assistance Sitting-balance support: Feet supported;Bilateral upper extremity supported Sitting balance-Leahy Scale: Fair Sitting balance - Comments: SUPV for static activites, able to lift arms for short periods of time     Standing balance-Leahy Scale: Poor                             ADL either performed or assessed with clinical judgement   ADL Overall ADL's : Needs assistance/impaired                                       General ADL Comments: MAX A for peri care, MIN A UB dressing, MAX A LB dressing     Vision       Perception     Praxis      Cognition Arousal/Alertness: Awake/alert Behavior During Therapy: WFL for tasks assessed/performed Overall Cognitive Status: Within Functional Limits for tasks assessed  General Comments: Pt A&Ox4          Exercises Other Exercises Other Exercises: education re: role of OT/rehab, progressive mobility, ADL task completion   Shoulder Instructions       General Comments vcs required for body mechanics, posterior chain activiation    Pertinent Vitals/ Pain       Pain Assessment: Faces Faces Pain Scale: Hurts a little bit Pain Location: R knee, buttock Pain Descriptors / Indicators: Aching;Discomfort Pain Intervention(s): Limited activity within patient's tolerance;Monitored during session  \ Frequency  Min 2X/week        Progress  Toward Goals  OT Goals(current goals can now be found in the care plan section)  Progress towards OT goals: Progressing toward goals  Acute Rehab OT Goals Patient Stated Goal: to get up and move OT Goal Formulation: With patient  Plan Discharge plan remains appropriate    Co-evaluation    PT/OT/SLP Co-Evaluation/Treatment: Yes Reason for Co-Treatment: To address functional/ADL transfers;For patient/therapist safety PT goals addressed during session: Mobility/safety with mobility OT goals addressed during session: ADL's and self-care      AM-PAC OT "6 Clicks" Daily Activity     Outcome Measure   Help from another person eating meals?: A Little Help from another person taking care of personal grooming?: A Little Help from another person toileting, which includes using toliet, bedpan, or urinal?: Total Help from another person bathing (including washing, rinsing, drying)?: A Lot Help from another person to put on and taking off regular upper body clothing?: A Lot Help from another person to put on and taking off regular lower body clothing?: Total 6 Click Score: 12    End of Session Equipment Utilized During Treatment: Gait belt  OT Visit Diagnosis: Unsteadiness on feet (R26.81);Repeated falls (R29.6);Muscle weakness (generalized) (M62.81);History of falling (Z91.81)   Activity Tolerance Patient limited by fatigue   Patient Left with call bell/phone within reach;with bed alarm set;in bed   Nurse Communication Mobility status        Time: 9628-3662 OT Time Calculation (min): 32 min  Charges: OT General Charges $OT Visit: 1 Visit OT Treatments $Self Care/Home Management : 8-22 mins $Therapeutic Activity: 8-22 mins  Shanon Payor, OTD OTR/L  07/25/21, 2:41 PM

## 2021-07-26 ENCOUNTER — Inpatient Hospital Stay: Payer: Medicare Other

## 2021-07-26 DIAGNOSIS — R531 Weakness: Secondary | ICD-10-CM | POA: Diagnosis not present

## 2021-07-26 DIAGNOSIS — Y92009 Unspecified place in unspecified non-institutional (private) residence as the place of occurrence of the external cause: Secondary | ICD-10-CM | POA: Diagnosis not present

## 2021-07-26 DIAGNOSIS — E876 Hypokalemia: Secondary | ICD-10-CM | POA: Diagnosis not present

## 2021-07-26 DIAGNOSIS — W19XXXA Unspecified fall, initial encounter: Secondary | ICD-10-CM | POA: Diagnosis not present

## 2021-07-26 LAB — LACTIC ACID, PLASMA
Lactic Acid, Venous: 0.9 mmol/L (ref 0.5–1.9)
Lactic Acid, Venous: 0.8 mmol/L (ref 0.5–1.9)

## 2021-07-26 LAB — PROCALCITONIN: Procalcitonin: 0.15 ng/mL

## 2021-07-26 LAB — BASIC METABOLIC PANEL
Anion gap: 9 (ref 5–15)
BUN: 12 mg/dL (ref 8–23)
CO2: 26 mmol/L (ref 22–32)
Calcium: 8.1 mg/dL — ABNORMAL LOW (ref 8.9–10.3)
Chloride: 99 mmol/L (ref 98–111)
Creatinine, Ser: 0.79 mg/dL (ref 0.61–1.24)
GFR, Estimated: >60 mL/min (ref >60)
Glucose, Bld: 94 mg/dL (ref 70–99)
Potassium: 3.5 mmol/L (ref 3.5–5.1)
Sodium: 134 mmol/L — ABNORMAL LOW (ref 135–145)

## 2021-07-26 LAB — GLUCOSE, CAPILLARY
Glucose-Capillary: 92 mg/dL (ref 70–99)
Glucose-Capillary: 124 mg/dL — ABNORMAL HIGH (ref 70–99)
Glucose-Capillary: 146 mg/dL — ABNORMAL HIGH (ref 70–99)
Glucose-Capillary: 159 mg/dL — ABNORMAL HIGH (ref 70–99)

## 2021-07-26 MED ORDER — INSULIN ASPART PROT & ASPART (70-30 MIX) 100 UNIT/ML ~~LOC~~ SUSP
8.0000 [IU] | Freq: Two times a day (BID) | SUBCUTANEOUS | Status: DC
  Filled 2021-07-26: qty 10

## 2021-07-26 MED ORDER — POTASSIUM CHLORIDE CRYS ER 20 MEQ PO TBCR
40.0000 meq | EXTENDED_RELEASE_TABLET | Freq: Once | ORAL | Status: AC
  Administered 2021-07-26: 40 meq via ORAL
  Filled 2021-07-26: qty 2

## 2021-07-26 MED ORDER — INSULIN ASPART PROT & ASPART (70-30 MIX) 100 UNIT/ML ~~LOC~~ SUSP
5.0000 [IU] | Freq: Two times a day (BID) | SUBCUTANEOUS | Status: DC
  Administered 2021-07-27 – 2021-08-02 (×12): 5 [IU] via SUBCUTANEOUS
  Filled 2021-07-26: qty 10

## 2021-07-26 NOTE — Progress Notes (Signed)
Progress Note    ILHAN MADAN  WGY:[id number] DOB: Mar 27, 1946  DOA: 07/20/2021 PCP: Orpah Greek, DO      Brief Narrative:    Medical records reviewed and are as summarized below:  SHAQUIL ALDANA is a 75 y.o. male with medical history significant for prostate cancer, hypertension, insulin-dependent diabetes mellitus, CAD, OSA, history of recurrent DVT, chronic anemia, who was brought to the hospital because of generalized weakness.  He had been laying on the floor since Monday, 07/18/2021 and had been unable to get up by himself.  He was admitted to the hospital for generalized weakness, hypomagnesemia and hypokalemia.  He was also found to have stage III decubitus ulcers on bilateral buttocks and deep tissue injury on the sacrum.    Assessment/Plan:   Principal Problem:   Generalized weakness Active Problems:   Diabetes mellitus without complication (HCC)   Hypertension   GERD (gastroesophageal reflux disease)   Fall at home, initial encounter   Hypokalemia   Rhabdomyolysis   Decubitus ulcer of buttock, stage 3 (HCC)   Body mass index is 41.14 kg/m.  Fever on 07/26/2021: Obtain blood cultures.  Chest x-ray showed mild right basilar subsegmental atelectasis or infiltrate.  Lactic acid and procalcitonin levels were normal.  Obtain CT scan of the chest for further evaluation before starting any antibiotics.  Generalized weakness/fall at home: Continue fall precautions.  Awaiting placement to SNF.  Hypokalemia and hypomagnesemia: Improved.  Continue potassium repletion  Iron deficiency anemia: Continue Ferrous sulfate. 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  B12 level is elevated at 6,343.  Insulin-dependent diabetes mellitus with hyperglycemia, hypoglycemia: He had hypoglycemia with blood sugar of 60 on the morning of 07/24/2021.  Decrease  NovoLog mix from 12 units twice daily to 10 units twice daily.  Stage III decubitus ulcers on bilateral buttocks, deep tissue injury on the sacrum: Continue local wound care.    Mildly elevated CK level (942, 967) : CK level stable.   History of recurrent DVT: Continue Eliquis  History of prostate cancer: Continue Casodex  Minimally displaced right distal clavicular fracture (06/28/2021): Outpatient follow-up with orthopedic surgeon.   Diet Order             Diet 2 gram sodium Room service appropriate? Yes; Fluid consistency: Thin  Diet effective now                      Consultants: None  Procedures: None    Medications:    apixaban  2.5 mg Oral BID   bicalutamide  50 mg Oral Daily   ferrous sulfate  325 mg Oral QODAY   insulin aspart  0-9 Units Subcutaneous TID WC   insulin aspart protamine- aspart  8 Units Subcutaneous BID WC   liver oil-zinc oxide   Topical TID   metoprolol tartrate  25 mg Oral QAC breakfast   oxybutynin  10 mg Oral QHS   pantoprazole  40 mg Oral Daily   Continuous Infusions:     Anti-infectives (From admission, onward)    Start     Dose/Rate Route Frequency Ordered Stop   07/21/21 1600  cefTRIAXone (ROCEPHIN) 2 g in sodium chloride 0.9 % 100 mL IVPB  Status:  Discontinued        2 g 200 mL/hr over 30 Minutes Intravenous Every 24 hours 07/20/21 1612 07/21/21 1552   07/20/21 1545  vancomycin (VANCOREADY) IVPB 2000 mg/400 mL        2,000 mg 200 mL/hr over 120 Minutes Intravenous  Once 07/20/21 1533 07/20/21 1934   07/20/21 1530  cefTRIAXone (ROCEPHIN) 2 g in sodium chloride 0.9 % 100 mL IVPB        2 g 200 mL/hr over 30 Minutes Intravenous  Once 07/20/21 1529 07/20/21 1718              Family Communication/Anticipated D/C date and plan/Code Status   DVT prophylaxis: apixaban (ELIQUIS) tablet 2.5 mg Start: 07/21/21 2200 apixaban (ELIQUIS) tablet 2.5 mg     Code Status: Full Code  Family Communication:  None Disposition Plan: Plan to discharge to home versus SNF   Status is: Inpatient  Remains inpatient appropriate because: Generalized weakness           Subjective:   He had fever with temperature of 101.1 F this morning.  No urinary symptoms or respiratory symptoms.  No other complaints.  Objective:    Vitals:   07/26/21 0042 07/26/21 0637 07/26/21 0739 07/26/21 1158  BP: (!) 144/70 133/64 (!) 144/70 134/76  Pulse: 72 76 75 73  Resp: 18 18 16 17   Temp: 98.5 F (36.9 C) (!) 101.1 F (38.4 C) 99.4 F (37.4 C) 99.8 F (37.7 C)  TempSrc:  Oral Oral Oral  SpO2: 96% 96% 96% 95%  Weight:      Height:       No data found.   Intake/Output Summary (Last 24 hours) at 07/26/2021 1227 Last data filed at 07/26/2021 1201 Gross per 24 hour  Intake 460 ml  Output 1000 ml  Net -540 ml   Filed Weights   07/20/21 1412 07/22/21 2110  Weight: 86.2 kg 133.8 kg    Exam:  GEN: NAD SKIN: Stage III decubitus ulcers on bilateral buttocks, deep tissue injury on sacrum EYES: No pallor or icterus ENT: MMM CV: RRR PULM: No wheezing or rales heard ABD: soft, ND, NT, +BS CNS: AAO x 3, non focal EXT: No edema or tenderness       Data Reviewed:   I have personally reviewed following labs and imaging studies:  Labs: Labs show the following:   Basic Metabolic Panel: Recent Labs  Lab 07/20/21 1416 07/20/21 1609 07/21/21 0550 07/22/21 0532 07/26/21 0829  NA 138  --  136 137 134*  K 2.7*  --  3.4* 3.6 3.5  CL 96*  --  100 100 99  CO2 30  --  29 29 26   GLUCOSE 272*  --  217* 124* 94  BUN 12  --  12 12 12   CREATININE 0.82  --  0.79 0.72 0.79  CALCIUM 8.1*  --  7.6* 7.8* 8.1*  MG  --  1.5* 1.8  --   --    GFR Estimated Creatinine Clearance: 113.1 mL/min (by C-G formula based on SCr of 0.79 mg/dL). Liver Function Tests: Recent Labs  Lab 07/20/21 1416  AST 41  ALT 17  ALKPHOS 128*  BILITOT 1.6*  PROT 6.8  ALBUMIN 2.7*   No results for input(s): LIPASE,  AMYLASE in the last 168 hours. No results for input(s): AMMONIA in the last 168 hours. Coagulation profile No results for input(s): INR, PROTIME in the last 168 hours.  CBC: Recent Labs  Lab 07/20/21 1416 07/21/21 0550  WBC 10.5 8.4  NEUTROABS 8.9*  --   HGB 11.5* 10.1*  HCT 34.8* 30.1*  MCV 86.4 87.5  PLT 283 231  Cardiac Enzymes: Recent Labs  Lab 07/20/21 1416 07/21/21 0858  CKTOTAL 942* 967*   BNP (last 3 results) No results for input(s): PROBNP in the last 8760 hours. CBG: Recent Labs  Lab 07/25/21 1146 07/25/21 1606 07/25/21 2125 07/26/21 0741 07/26/21 1155  GLUCAP 161* 131* 109* 92 124*   D-Dimer: No results for input(s): DDIMER in the last 72 hours. Hgb A1c: No results for input(s): HGBA1C in the last 72 hours. Lipid Profile: No results for input(s): CHOL, HDL, LDLCALC, TRIG, CHOLHDL, LDLDIRECT in the last 72 hours. Thyroid function studies: No results for input(s): TSH, T4TOTAL, T3FREE, THYROIDAB in the last 72 hours.  Invalid input(s): FREET3 Anemia work up: No results for input(s): VITAMINB12, FOLATE, FERRITIN, TIBC, IRON, RETICCTPCT in the last 72 hours.  Sepsis Labs: Recent Labs  Lab 07/20/21 1416 07/20/21 1609 07/20/21 1719 07/21/21 0550 07/26/21 0829 07/26/21 0837 07/26/21 1038  PROCALCITON  --   --   --   --  0.15  --   --   WBC 10.5  --   --  8.4  --   --   --   LATICACIDVEN  --  1.4 1.4  --   --  0.9 0.8    Microbiology Recent Results (from the past 240 hour(s))  Resp Panel by RT-PCR (Flu A&B, Covid) Nasopharyngeal Swab     Status: None   Collection Time: 07/20/21  2:16 PM   Specimen: Nasopharyngeal Swab; Nasopharyngeal(NP) swabs in vial transport medium  Result Value Ref Range Status   SARS Coronavirus 2 by RT PCR NEGATIVE NEGATIVE Final    Comment: (NOTE) SARS-CoV-2 target nucleic acids are NOT DETECTED.  The SARS-CoV-2 RNA is generally detectable in upper respiratory specimens during the acute phase of infection. The  lowest concentration of SARS-CoV-2 viral copies this assay can detect is 138 copies/mL. A negative result does not preclude SARS-Cov-2 infection and should not be used as the sole basis for treatment or other patient management decisions. A negative result may occur with  improper specimen collection/handling, submission of specimen other than nasopharyngeal swab, presence of viral mutation(s) within the areas targeted by this assay, and inadequate number of viral copies(<138 copies/mL). A negative result must be combined with clinical observations, patient history, and epidemiological information. The expected result is Negative.  Fact Sheet for Patients:  EntrepreneurPulse.com.au  Fact Sheet for Healthcare Providers:  IncredibleEmployment.be  This test is no t yet approved or cleared by the [place] FDA and  has been authorized for detection and/or diagnosis of SARS-CoV-2 by FDA under an Emergency Use Authorization (EUA). This EUA will remain  in effect (meaning this test can be used) for the duration of the COVID-19 declaration under Section 564(b)(1) of the Act, 21 U.S.C.section 360bbb-3(b)(1), unless the authorization is terminated  or revoked sooner.       Influenza A by PCR NEGATIVE NEGATIVE Final   Influenza B by PCR NEGATIVE NEGATIVE Final    Comment: (NOTE) The Xpert Xpress SARS-CoV-2/FLU/RSV plus assay is intended as an aid in the diagnosis of influenza from Nasopharyngeal swab specimens and should not be used as a sole basis for treatment. Nasal washings and aspirates are unacceptable for Xpert Xpress SARS-CoV-2/FLU/RSV testing.  Fact Sheet for Patients: EntrepreneurPulse.com.au  Fact Sheet for Healthcare Providers: IncredibleEmployment.be  This test is not yet approved or cleared by the [place] FDA and has been authorized for detection and/or diagnosis of SARS-CoV-2 by FDA under  an Emergency Use Authorization (EUA). This EUA will remain in  effect (meaning this test can be used) for the duration of the COVID-19 declaration under Section 564(b)(1) of the Act, 21 U.S.C. section 360bbb-3(b)(1), unless the authorization is terminated or revoked.  Performed at [facility], [address] [place], [place] [address]   Blood culture (routine x 2)     Status: None   Collection Time: 07/20/21  4:09 PM   Specimen: BLOOD  Result Value Ref Range Status   Specimen Description BLOOD BLOOD RIGHT HAND  Final   Special Requests   Final    BOTTLES DRAWN AEROBIC AND ANAEROBIC Blood Culture adequate volume   Culture   Final    NO GROWTH 5 DAYS Performed at [facility], [address] [place], [place] [address]    Report Status 07/25/2021 FINAL  Final  Blood culture (routine x 2)     Status: None   Collection Time: 07/20/21  4:15 PM   Specimen: BLOOD  Result Value Ref Range Status   Specimen Description BLOOD RIGHT ANTECUBITAL  Final   Special Requests   Final    BOTTLES DRAWN AEROBIC AND ANAEROBIC Blood Culture adequate volume   Culture   Final    NO GROWTH 5 DAYS Performed at [facility], [address], [place], [place] [address]    Report Status 07/25/2021 FINAL  Final    Procedures and diagnostic studies:  [facility] Chest Port 1 View  Result Date: 07/26/2021 CLINICAL DATA:  Fever. EXAM: PORTABLE CHEST 1 VIEW COMPARISON:  July 20, 2021. FINDINGS: Stable cardiomediastinal silhouette. No pneumothorax is noted. Left lung is clear. Mild right basilar subsegmental atelectasis or infiltrate is noted. Bony thorax is unremarkable. IMPRESSION: Mild right basilar subsegmental atelectasis or infiltrate is noted. Electronically Signed   By: Marijo Conception M.D.   On: 07/26/2021 09:30               LOS: 6 days   Maziah Keeling  [facility]   Pager on [email or web address]. If 7PM-7AM, please contact night-coverage at  [email or web address]     07/26/2021, 12:27 PM

## 2021-07-26 NOTE — TOC Progression Note (Signed)
Transition of Care [facility]) - Progression Note    Patient Details  Name: Christopher Collier MRN: [id number] Date of Birth: 10/21/1945  Transition of Care [facility]) CM/SW [place], RN Phone Number: 07/26/2021, 2:46 PM  Clinical Narrative:   Patient and spouse want daughter to decide on SNF.  TOC attempted to contact daughter for past 2 days, not available and no calls back recorded to date.  Will review SNF beds with daughter upon response.  TOC to follow.    Expected Discharge Plan: [place] Barriers to Discharge: Continued Medical Work up  Expected Discharge Plan and Services Expected Discharge Plan: [place]   Discharge Planning Services: CM Consult Post Acute Care Choice: [place] Living arrangements for the past 2 months: Single Family Home                 DME Arranged:  (anticipated to go to SNF)         HH Arranged:  (Anticipated DC to sNF)           Social Determinants of Health (SDOH) Interventions    Readmission Risk Interventions No flowsheet data found.

## 2021-07-27 ENCOUNTER — Encounter: Payer: Self-pay | Admitting: Internal Medicine

## 2021-07-27 ENCOUNTER — Inpatient Hospital Stay: Payer: Medicare Other

## 2021-07-27 DIAGNOSIS — L89303 Pressure ulcer of unspecified buttock, stage 3: Secondary | ICD-10-CM | POA: Diagnosis not present

## 2021-07-27 DIAGNOSIS — I10 Essential (primary) hypertension: Secondary | ICD-10-CM

## 2021-07-27 DIAGNOSIS — R531 Weakness: Secondary | ICD-10-CM | POA: Diagnosis not present

## 2021-07-27 LAB — CBC WITH DIFFERENTIAL/PLATELET
Abs Immature Granulocytes: 0.08 10*3/uL — ABNORMAL HIGH (ref 0.00–0.07)
Basophils Absolute: 0 10*3/uL (ref 0.0–0.1)
Basophils Relative: 0 %
Eosinophils Absolute: 0 10*3/uL (ref 0.0–0.5)
Eosinophils Relative: 1 %
HCT: 30.8 % — ABNORMAL LOW (ref 39.0–52.0)
Hemoglobin: 9.9 g/dL — ABNORMAL LOW (ref 13.0–17.0)
Immature Granulocytes: 1 %
Lymphocytes Relative: 10 %
Lymphs Abs: 0.8 10*3/uL (ref 0.7–4.0)
MCH: 28.1 pg (ref 26.0–34.0)
MCHC: 32.1 g/dL (ref 30.0–36.0)
MCV: 87.5 fL (ref 80.0–100.0)
Monocytes Absolute: 0.9 10*3/uL (ref 0.1–1.0)
Monocytes Relative: 10 %
Neutro Abs: 6.5 10*3/uL (ref 1.7–7.7)
Neutrophils Relative %: 78 %
Platelets: 336 10*3/uL (ref 150–400)
RBC: 3.52 MIL/uL — ABNORMAL LOW (ref 4.22–5.81)
RDW: 15.9 % — ABNORMAL HIGH (ref 11.5–15.5)
WBC: 8.3 10*3/uL (ref 4.0–10.5)
nRBC: 0 % (ref 0.0–0.2)

## 2021-07-27 LAB — GLUCOSE, CAPILLARY
Glucose-Capillary: 121 mg/dL — ABNORMAL HIGH (ref 70–99)
Glucose-Capillary: 162 mg/dL — ABNORMAL HIGH (ref 70–99)
Glucose-Capillary: 165 mg/dL — ABNORMAL HIGH (ref 70–99)
Glucose-Capillary: 173 mg/dL — ABNORMAL HIGH (ref 70–99)

## 2021-07-27 LAB — PROCALCITONIN: Procalcitonin: 0.36 ng/mL

## 2021-07-27 NOTE — TOC Progression Note (Signed)
Transition of Care [facility]) - Progression Note    Patient Details  Name: Christopher Collier MRN: [id number] Date of Birth: [birth year]  Transition of Care [facility]) CM/SW [place], RN Phone Number: 07/27/2021, 2:23 PM  Clinical Narrative:   [facility] faxed [phone] Faxed [phone] [facility] faxed [phone]  [facility] has no beds [place] at [facility] and rehab and [place] in [place]-no beds until next week per [place].     Expected Discharge Plan: [place] Barriers to Discharge: Continued Medical Work up  Expected Discharge Plan and Services Expected Discharge Plan: [place]   Discharge Planning Services: CM Consult Post Acute Care Choice: [place] Living arrangements for the past 2 months: Single Family Home                 DME Arranged:  (anticipated to go to SNF)         HH Arranged:  (Anticipated DC to sNF)           Social Determinants of Health (SDOH) Interventions    Readmission Risk Interventions No flowsheet data found.

## 2021-07-27 NOTE — TOC Progression Note (Signed)
Transition of Care [facility]) - Progression Note    Patient Details  Name: Christopher Collier MRN: [id number] Date of Birth: 19-Jan-1946  Transition of Care [facility]) CM/SW [place], RN Phone Number: 07/27/2021, 1:24 PM  Clinical Narrative:   Spoke to patient's daughter today.  She states that she was not able to get phone service on vacation, and was unable to respond to requests.  Informed daughter of bed offers in [place], and she stated [facility] was not acceptable.  She would like [place] or [place].  Per MD, patient is ready to discharge tomorrow.  RNCM explained to patient's daughter that there is a chance of financial responsibility if there is a delay in SNF placement.  RNCM reached out to [facility], who is still pending in portal, to check for acceptance.  Calling facilities in [place] for bed availability.  TOC contact information provided to daughter, TOC to follow to discharge.    Expected Discharge Plan: [place] Barriers to Discharge: Continued Medical Work up  Expected Discharge Plan and Services Expected Discharge Plan: [place]   Discharge Planning Services: CM Consult Post Acute Care Choice: [place] Living arrangements for the past 2 months: Single Family Home                 DME Arranged:  (anticipated to go to SNF)         HH Arranged:  (Anticipated DC to sNF)           Social Determinants of Health (SDOH) Interventions    Readmission Risk Interventions No flowsheet data found.

## 2021-07-27 NOTE — Progress Notes (Signed)
PT Cancellation Note  Patient Details Name: Christopher Collier MRN: [id number] DOB: 1946/06/11   Cancelled Treatment:    Reason Eval/Treat Not Completed: Other (comment) Pt lethargic requiring several attempts to awaken. Upon awakening, denies treatment secondary to fatigue. PT to reassess as able.   [employer], SPT

## 2021-07-27 NOTE — Progress Notes (Signed)
PROGRESS NOTE    Christopher SCHWENKE  INO:[id number] DOB: Apr 03, 1946 DOA: 07/20/2021 PCP: Orpah Greek, DO  Assessment & Plan:   Principal Problem:   Generalized weakness Active Problems:   Diabetes mellitus without complication ([place])   Hypertension   GERD (gastroesophageal reflux disease)   Fall at home, initial encounter   Hypokalemia   Rhabdomyolysis   Decubitus ulcer of buttock, stage 3 (HCC)    Fever: etiology unclear. Afebrile x 24 hrs. Blood cxs NGTD.  CXR showed mild right basilar subsegmental atelectasis or infiltrate. Procal 0.36. CT chest ordered.    Generalized weakness: s/p fall at home. Continue fall precautions. Still awaiting SNF placement   Hypokalemia: WNL today  IDA: continue on iron supplement    DM2: likely poorly controlled. Continue on SSI w/ accuchecks     Stage III decubitus ulcers on bilateral buttocks: w/ deep tissue injury on the sacrum. Continue local wound care.     Mildly elevated CK level : CK level stable.    History of recurrent DVT: continue eliquis   History of prostate cancer: continue casodex   Minimally displaced right distal clavicular fracture: outpatient follow-up with ortho surg    DVT prophylaxis: eliquis  Code Status: full  Family Communication: Disposition Plan: waiting on SNF placement   Level of care: Med-Surg  Status is: Inpatient  Remains inpatient appropriate because: medically stable for d/c and waiting on SNF placement still      Consultants:    Procedures:   Antimicrobials:   Subjective: Pt c/o fatigue   Objective: Vitals:   07/26/21 2155 07/26/21 2354 07/27/21 0332 07/27/21 0619  BP: 133/61 138/68  138/69  Pulse: 72 70  74  Resp: 16 18 18 17   Temp: 99.4 F (37.4 C) 98.9 F (37.2 C)  (!) 97.5 F (36.4 C)  TempSrc:  Oral    SpO2: 96% 97% 96% 94%  Weight:      Height:        Intake/Output Summary (Last 24 hours) at 07/27/2021 0726 Last data filed at 07/26/2021 1413 Gross per  24 hour  Intake --  Output 900 ml  Net -900 ml   Filed Weights   07/20/21 1412 07/22/21 2110  Weight: 86.2 kg 133.8 kg    Examination:  General exam: Appears calm and comfortable  Respiratory system: Clear to auscultation. Respiratory effort normal. Cardiovascular system: S1 & S2 +. No rubs, gallops or clicks.  Gastrointestinal system: Abdomen is nondistended, soft and nontender. Normal bowel sounds heard. Central nervous system: Alert and oriented. Moves all extremities  Psychiatry: Judgement and insight appear normal. Flat mood and affect     Data Reviewed: I have personally reviewed following labs and imaging studies  CBC: Recent Labs  Lab 07/20/21 1416 07/21/21 0550 07/27/21 0530  WBC 10.5 8.4 8.3  NEUTROABS 8.9*  --  6.5  HGB 11.5* 10.1* 9.9*  HCT 34.8* 30.1* 30.8*  MCV 86.4 87.5 87.5  PLT 283 231 096   Basic Metabolic Panel: Recent Labs  Lab 07/20/21 1416 07/20/21 1609 07/21/21 0550 07/22/21 0532 07/26/21 0829  NA 138  --  136 137 134*  K 2.7*  --  3.4* 3.6 3.5  CL 96*  --  100 100 99  CO2 30  --  29 29 26   GLUCOSE 272*  --  217* 124* 94  BUN 12  --  12 12 12   CREATININE 0.82  --  0.79 0.72 0.79  CALCIUM 8.1*  --  7.6* 7.8* 8.1*  MG  --  1.5* 1.8  --   --    GFR: Estimated Creatinine Clearance: 113.1 mL/min (by C-G formula based on SCr of 0.79 mg/dL). Liver Function Tests: Recent Labs  Lab 07/20/21 1416  AST 41  ALT 17  ALKPHOS 128*  BILITOT 1.6*  PROT 6.8  ALBUMIN 2.7*   No results for input(s): LIPASE, AMYLASE in the last 168 hours. No results for input(s): AMMONIA in the last 168 hours. Coagulation Profile: No results for input(s): INR, PROTIME in the last 168 hours. Cardiac Enzymes: Recent Labs  Lab 07/20/21 1416 07/21/21 0858  CKTOTAL 942* 967*   BNP (last 3 results) No results for input(s): PROBNP in the last 8760 hours. HbA1C: No results for input(s): HGBA1C in the last 72 hours. CBG: Recent Labs  Lab 07/25/21 2125  07/26/21 0741 07/26/21 1155 07/26/21 1623 07/26/21 2158  GLUCAP 109* 92 124* 146* 159*   Lipid Profile: No results for input(s): CHOL, HDL, LDLCALC, TRIG, CHOLHDL, LDLDIRECT in the last 72 hours. Thyroid Function Tests: No results for input(s): TSH, T4TOTAL, FREET4, T3FREE, THYROIDAB in the last 72 hours. Anemia Panel: No results for input(s): VITAMINB12, FOLATE, FERRITIN, TIBC, IRON, RETICCTPCT in the last 72 hours. Sepsis Labs: Recent Labs  Lab 07/20/21 1609 07/20/21 1719 07/26/21 0829 07/26/21 0837 07/26/21 1038 07/27/21 0530  PROCALCITON  --   --  0.15  --   --  0.36  LATICACIDVEN 1.4 1.4  --  0.9 0.8  --     Recent Results (from the past 240 hour(s))  Resp Panel by RT-PCR (Flu A&B, Covid) Nasopharyngeal Swab     Status: None   Collection Time: 07/20/21  2:16 PM   Specimen: Nasopharyngeal Swab; Nasopharyngeal(NP) swabs in vial transport medium  Result Value Ref Range Status   SARS Coronavirus 2 by RT PCR NEGATIVE NEGATIVE Final    Comment: (NOTE) SARS-CoV-2 target nucleic acids are NOT DETECTED.  The SARS-CoV-2 RNA is generally detectable in upper respiratory specimens during the acute phase of infection. The lowest concentration of SARS-CoV-2 viral copies this assay can detect is 138 copies/mL. A negative result does not preclude SARS-Cov-2 infection and should not be used as the sole basis for treatment or other patient management decisions. A negative result may occur with  improper specimen collection/handling, submission of specimen other than nasopharyngeal swab, presence of viral mutation(s) within the areas targeted by this assay, and inadequate number of viral copies(<138 copies/mL). A negative result must be combined with clinical observations, patient history, and epidemiological information. The expected result is Negative.  Fact Sheet for Patients:  EntrepreneurPulse.com.au  Fact Sheet for Healthcare Providers:   IncredibleEmployment.be  This test is no t yet approved or cleared by the [place] FDA and  has been authorized for detection and/or diagnosis of SARS-CoV-2 by FDA under an Emergency Use Authorization (EUA). This EUA will remain  in effect (meaning this test can be used) for the duration of the COVID-19 declaration under Section 564(b)(1) of the Act, 21 U.S.C.section 360bbb-3(b)(1), unless the authorization is terminated  or revoked sooner.       Influenza A by PCR NEGATIVE NEGATIVE Final   Influenza B by PCR NEGATIVE NEGATIVE Final    Comment: (NOTE) The Xpert Xpress SARS-CoV-2/FLU/RSV plus assay is intended as an aid in the diagnosis of influenza from Nasopharyngeal swab specimens and should not be used as a sole basis for treatment. Nasal washings and aspirates are unacceptable for Xpert Xpress SARS-CoV-2/FLU/RSV testing.  Fact Sheet for Patients:  EntrepreneurPulse.com.au  Fact Sheet for Healthcare Providers: IncredibleEmployment.be  This test is not yet approved or cleared by the [place] FDA and has been authorized for detection and/or diagnosis of SARS-CoV-2 by FDA under an Emergency Use Authorization (EUA). This EUA will remain in effect (meaning this test can be used) for the duration of the COVID-19 declaration under Section 564(b)(1) of the Act, 21 U.S.C. section 360bbb-3(b)(1), unless the authorization is terminated or revoked.  Performed at [facility], [address] [place], [place] [address]   Blood culture (routine x 2)     Status: None   Collection Time: 07/20/21  4:09 PM   Specimen: BLOOD  Result Value Ref Range Status   Specimen Description BLOOD BLOOD RIGHT HAND  Final   Special Requests   Final    BOTTLES DRAWN AEROBIC AND ANAEROBIC Blood Culture adequate volume   Culture   Final    NO GROWTH 5 DAYS Performed at [facility], [address], [place], [place]  [address]    Report Status 07/25/2021 FINAL  Final  Blood culture (routine x 2)     Status: None   Collection Time: 07/20/21  4:15 PM   Specimen: BLOOD  Result Value Ref Range Status   Specimen Description BLOOD RIGHT ANTECUBITAL  Final   Special Requests   Final    BOTTLES DRAWN AEROBIC AND ANAEROBIC Blood Culture adequate volume   Culture   Final    NO GROWTH 5 DAYS Performed at [facility], [address], [place], [place] [address]    Report Status 07/25/2021 FINAL  Final         Radiology Studies: [facility] Chest Port 1 View  Result Date: 07/26/2021 CLINICAL DATA:  Fever. EXAM: PORTABLE CHEST 1 VIEW COMPARISON:  July 20, 2021. FINDINGS: Stable cardiomediastinal silhouette. No pneumothorax is noted. Left lung is clear. Mild right basilar subsegmental atelectasis or infiltrate is noted. Bony thorax is unremarkable. IMPRESSION: Mild right basilar subsegmental atelectasis or infiltrate is noted. Electronically Signed   By: Marijo Conception M.D.   On: 07/26/2021 09:30        Scheduled Meds:  apixaban  2.5 mg Oral BID   bicalutamide  50 mg Oral Daily   ferrous sulfate  325 mg Oral QODAY   insulin aspart  0-9 Units Subcutaneous TID WC   insulin aspart protamine- aspart  5 Units Subcutaneous BID WC   liver oil-zinc oxide   Topical TID   metoprolol tartrate  25 mg Oral QAC breakfast   oxybutynin  10 mg Oral QHS   pantoprazole  40 mg Oral Daily   Continuous Infusions:   LOS: 7 days    Time spent: 30 mins     Wyvonnia Dusky, MD [facility] Pager [phone]  If 7PM-7AM, please contact night-coverage 07/27/2021, 7:26 AM

## 2021-07-27 NOTE — Progress Notes (Signed)
Occupational Therapy Treatment Patient Details Name: Christopher Collier MRN: [id number] DOB: 1946/07/10 Today's Date: 07/27/2021   History of present illness Pt is a 75 y.o. male presenting to hospital 10/26 with generalized weakness (pt laid on floor on Monday and unable to get up since then).  Pt addmited with generalized weaknes/possible fall? (per chart pt fell 3 weeks ago and has R clavicular fx 06/28/21), hypokalemia, DM, and stage III decubitus ulcer with surrounding cellulitis.  PMH includes incontinence, DM, DVT, htn, MRSA, sleep apnea, UTI, and h/o prostate CA.   OT comments  Upon entering the room, pt supine in bed with no c/o pain and very lethargic throughout session. OT provided pt with washcloth for face as an attempt to alert him further. He needed assistance bringing R hand to face to wash. He was set up to brush teeth with therapist again guiding use of R UE to mouth for him to brush. Pt continues to need increased cuing to alert him to session. Exercises of 2 sets of 10 ankle pumps and glute squeezes with rest breaks as needed and min cuing for technique. Pt requesting assistance to call in lunch order. He began falling asleep and drops phone from hand multiple times. RN notified of pt's condition. All needs within reach before therapist exits the room.   Recommendations for follow up therapy are one component of a multi-disciplinary discharge planning process, led by the attending physician.  Recommendations may be updated based on patient status, additional functional criteria and insurance authorization.    Follow Up Recommendations  Skilled nursing-short term rehab (<3 hours/day)    Assistance Recommended at Discharge Intermittent Supervision/Assistance  Equipment Recommendations  Other (comment) (defer to next venue of care)       Precautions / Restrictions Precautions Precautions: Fall Restrictions Weight Bearing Restrictions: Yes RUE Weight Bearing: Weight bearing as  tolerated Other Position/Activity Restrictions: Per Dr. Steva Colder ([place]) note 07/18/21 (orthopaedic f/u visit): "I encouraged him to begin gently actively using his right upper extremity as tolerated."              ADL either performed or assessed with clinical judgement     Vision Patient Visual Report: No change from baseline            Cognition Arousal/Alertness: Lethargic Behavior During Therapy: WFL for tasks assessed/performed Overall Cognitive Status: Within Functional Limits for tasks assessed                                                       Pertinent Vitals/ Pain       Pain Assessment: Faces Faces Pain Scale: No hurt         Frequency  Min 2X/week        Progress Toward Goals  OT Goals(current goals can now be found in the care plan section)  Progress towards OT goals: Progressing toward goals  Acute Rehab OT Goals Patient Stated Goal: to walk again OT Goal Formulation: With patient Time For Goal Achievement: 08/05/21 Potential to Achieve Goals: [place] Discharge plan remains appropriate;Frequency remains appropriate       AM-PAC OT "6 Clicks" Daily Activity     Outcome Measure   Help from another person eating meals?: A Lot Help from another person taking care of personal grooming?: A Lot Help  from another person toileting, which includes using toliet, bedpan, or urinal?: Total Help from another person bathing (including washing, rinsing, drying)?: A Lot Help from another person to put on and taking off regular upper body clothing?: A Lot Help from another person to put on and taking off regular lower body clothing?: Total 6 Click Score: 10    End of Session    OT Visit Diagnosis: Unsteadiness on feet (R26.81);Repeated falls (R29.6);Muscle weakness (generalized) (M62.81);History of falling (Z91.81)   Activity Tolerance Patient limited by fatigue   Patient Left with call bell/phone  within reach;with bed alarm set;in bed   Nurse Communication Other (comment) (fatigue)        Time: 1034-1100 OT Time Calculation (min): 26 min  Charges: OT General Charges $OT Visit: 1 Visit OT Treatments $Self Care/Home Management : 8-22 mins $Therapeutic Exercise: 8-22 mins  Darleen Crocker, MS, OTR/L , CBIS ascom [phone]  07/27/21, 1:19 PM

## 2021-07-28 DIAGNOSIS — R531 Weakness: Secondary | ICD-10-CM | POA: Diagnosis not present

## 2021-07-28 DIAGNOSIS — I10 Essential (primary) hypertension: Secondary | ICD-10-CM | POA: Diagnosis not present

## 2021-07-28 DIAGNOSIS — L89303 Pressure ulcer of unspecified buttock, stage 3: Secondary | ICD-10-CM | POA: Diagnosis not present

## 2021-07-28 LAB — GLUCOSE, CAPILLARY
Glucose-Capillary: 109 mg/dL — ABNORMAL HIGH (ref 70–99)
Glucose-Capillary: 153 mg/dL — ABNORMAL HIGH (ref 70–99)
Glucose-Capillary: 108 mg/dL — ABNORMAL HIGH (ref 70–99)
Glucose-Capillary: 114 mg/dL — ABNORMAL HIGH (ref 70–99)

## 2021-07-28 LAB — PROCALCITONIN: Procalcitonin: 0.32 ng/mL

## 2021-07-28 NOTE — Progress Notes (Addendum)
Physical Therapy Treatment Patient Details Name: Christopher Collier MRN: [id number] DOB: 02/28/1946 Today's Date: 07/28/2021   History of Present Illness Pt is a 75 y.o. male presenting to hospital 10/26 with generalized weakness (pt laid on floor on Monday and unable to get up since then).  Pt addmited with generalized weaknes/possible fall? (per chart pt fell 3 weeks ago and has R clavicular fx 06/28/21), hypokalemia, DM, and stage III decubitus ulcer with surrounding cellulitis.  PMH includes incontinence, DM, DVT, htn, MRSA, sleep apnea, UTI, and h/o prostate CA.    PT Comments    Pt alert, agreeable to treatment and overall cooperative and pleasant. Pt demonstrated progress in the level of assistance needed for mobility compared to previous session. Pt able to come to EOB w/ bed rails with slight MIN-A for trunk support but performed the majority without physical assist. Pt was able to complete x 2 STS transfers w/ MAX-Ax2 but support for static standing decreases to MIN-Ax2 w/ RW. Pt was able to take steps backwards to obtain better COM without physical assist or tactile cuing this session as well. Current discharge recommendations remain SNF to maximize pt function. Skilled PT intervention is indicated to address deficits in function, mobility, and to return to PLOF as able.     Recommendations for follow up therapy are one component of a multi-disciplinary discharge planning process, led by the attending physician.  Recommendations may be updated based on patient status, additional functional criteria and insurance authorization.  Follow Up Recommendations  Skilled nursing-short term rehab (<3 hours/day)     Assistance Recommended at Discharge Frequent or constant Supervision/Assistance  Equipment Recommendations  Other (comment) (TBD next venue of care)    Recommendations for Other Services       Precautions / Restrictions Precautions Precautions: Fall Restrictions Weight Bearing  Restrictions: Yes Other Position/Activity Restrictions: Per Dr. Steva Colder ([place]) note 07/18/21 (orthopaedic f/u visit): "I encouraged him to begin gently actively using his right upper extremity as tolerated."     Mobility  Bed Mobility Overal bed mobility: Needs Assistance Bed Mobility: Supine to Sit;Sit to Supine Rolling: Min guard   Supine to sit: Min assist Sit to supine: Mod assist   General bed mobility comments: slight min-A for trunk assist supine > sit; BLE returning to bed    Transfers Overall transfer level: Needs assistance Equipment used: 2 person hand held assist;Rolling walker (2 wheels) Transfers: Sit to/from Stand Sit to Stand: Max assist;+2 physical assistance;From elevated surface           General transfer comment: STS x 2 blocking both feet, once stannding pt decreases to min-A for standing w/ RW;    Ambulation/Gait Ambulation/Gait assistance: Min assist Gait Distance (Feet): 0.5 Feet Assistive device: Rolling walker (2 wheels) Gait Pattern/deviations: Step-to pattern     General Gait Details: Pt able to take 2 small steps backward with MIN-A, RW   Stairs             Wheelchair Mobility    Modified Rankin (Stroke Patients Only)       Balance Overall balance assessment: Needs assistance Sitting-balance support: Feet supported;Bilateral upper extremity supported Sitting balance-Leahy Scale: Fair Sitting balance - Comments: requires supervision for dynamic activites but is able to sit without full support of BUE     Standing balance-Leahy Scale: Poor Standing balance comment: requires BUE support  Cognition Arousal/Alertness: Awake/alert Behavior During Therapy: WFL for tasks assessed/performed Overall Cognitive Status: Within Functional Limits for tasks assessed                                          Exercises General Exercises - Lower  Extremity Ankle Circles/Pumps: AROM;Both;10 reps;Supine Quad Sets: AROM;Strengthening;Both;10 reps;Supine Long Arc Quad: AROM;Both;10 reps;Seated Hip ABduction/ADduction: AAROM;Strengthening;Both;10 reps;Supine    General Comments        Pertinent Vitals/Pain Pain Assessment: Faces Faces Pain Scale: Hurts little more Pain Location: buttock, R knee Pain Descriptors / Indicators: Aching;Discomfort Pain Intervention(s): Limited activity within patient's tolerance;Monitored during session;Repositioned    Home Living                          Prior Function            PT Goals (current goals can now be found in the care plan section) Progress towards PT goals: Progressing toward goals    Frequency    Min 2X/week      PT Plan Current plan remains appropriate    Co-evaluation              AM-PAC PT "6 Clicks" Mobility   Outcome Measure  Help needed turning from your back to your side while in a flat bed without using bedrails?: A Little Help needed moving from lying on your back to sitting on the side of a flat bed without using bedrails?: A Lot Help needed moving to and from a bed to a chair (including a wheelchair)?: A Lot Help needed standing up from a chair using your arms (e.g., wheelchair or bedside chair)?: A Lot Help needed to walk in hospital room?: Total Help needed climbing 3-5 steps with a railing? : Total 6 Click Score: 11    End of Session Equipment Utilized During Treatment: Gait belt Activity Tolerance: Patient tolerated treatment well Patient left: in bed;with call bell/phone within reach;Other (comment) (prevlon boots) Nurse Communication: Mobility status PT Visit Diagnosis: Other abnormalities of gait and mobility (R26.89);Muscle weakness (generalized) (M62.81);History of falling (Z91.81);Pain     Time: 1128-1200 PT Time Calculation (min) (ACUTE ONLY): 32 min  Charges:                        [employer], SPT

## 2021-07-28 NOTE — TOC Progression Note (Signed)
Transition of Care [facility]) - Progression Note    Patient Details  Name: Christopher Collier MRN: [id number] Date of Birth: 01/07/1946  Transition of Care [facility]) CM/SW [place], RN Phone Number: 07/28/2021, 3:14 PM  Clinical Narrative:  Daughter requested [facility], re-faxed information, waiting for response.  Daughter continues to state [place] facilities are not appropriate for patient at this time.  TOC contact information provided TOC to follow.     Expected Discharge Plan: [place] Barriers to Discharge: Continued Medical Work up  Expected Discharge Plan and Services Expected Discharge Plan: [place]   Discharge Planning Services: CM Consult Post Acute Care Choice: [place] Living arrangements for the past 2 months: Single Family Home                 DME Arranged:  (anticipated to go to SNF)         HH Arranged:  (Anticipated DC to sNF)           Social Determinants of Health (SDOH) Interventions    Readmission Risk Interventions No flowsheet data found.

## 2021-07-28 NOTE — Progress Notes (Signed)
PROGRESS NOTE    Christopher Collier  ERX:[id number] DOB: 25-Jun-1946 DOA: 07/20/2021 PCP: Orpah Greek, DO  Assessment & Plan:   Principal Problem:   Generalized weakness Active Problems:   Diabetes mellitus without complication ([place])   Hypertension   GERD (gastroesophageal reflux disease)   Fall at home, initial encounter   Hypokalemia   Rhabdomyolysis   Decubitus ulcer of buttock, stage 3 (HCC)    Fever: etiology unclear. Afebrile x 48 hours. Blood cxs NGTD. Procal 0.36. 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 Continue fall precautions. Waiting on SNF placement still, CM is working on this   Hypokalemia: within normal limits   IDA: continue on iron supplements    DM2: likely poorly controlled. Continue on 70/30, SSI w/ accuchecks      Stage III decubitus ulcers on bilateral buttocks: w/ deep tissue injury on the sacrum. Continue local wound care.     Mildly elevated CK level : CK level stable.    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 Pt was previously aware of pulmonary nodules as per pt. Management per onco as an outpatient    Minimally displaced right distal clavicular fracture: outpatient follow-up with ortho surg     DVT prophylaxis: eliquis  Code Status: full  Family Communication: Disposition Plan: still waiting on SNF placement, CM is working on this    Level of care: Med-Surg  Status is: Inpatient  Remains inpatient appropriate because: medically stable for d/c and waiting on SNF placement still      Consultants:    Procedures:   Antimicrobials:   Subjective: Pt c/o malaise    Objective: Vitals:   07/27/21 1617 07/27/21 1957 07/28/21 0034 07/28/21 0525  BP: 130/67 133/69 131/67 138/67  Pulse: 72 72 70 69  Resp:  16 18 16   Temp:  99.5 F (37.5 C) 98.9 F (37.2 C) 98.4 F (36.9 C) 99 F (37.2 C)  TempSrc: Oral Oral Oral   SpO2: 98% 100% 96% 100%  Weight:      Height:        Intake/Output Summary (Last 24 hours) at 07/28/2021 0728 Last data filed at 07/28/2021 0105 Gross per 24 hour  Intake 717 ml  Output 700 ml  Net 17 ml   Filed Weights   07/20/21 1412 07/22/21 2110  Weight: 86.2 kg 133.8 kg    Examination:  General exam: Appears comfortable  Respiratory system: clear breath sounds b/l. No rales  Cardiovascular system: S1/S2+. No rubs or gallops  Gastrointestinal system: Abd is soft, NT, obese & normal bowel sounds  Central nervous system: alert and oriented. Moves all extremities   Psychiatry: Judgement and insight appear normal. Flat mood and affect    Data Reviewed: I have personally reviewed following labs and imaging studies  CBC: Recent Labs  Lab 07/27/21 0530  WBC 8.3  NEUTROABS 6.5  HGB 9.9*  HCT 30.8*  MCV 87.5  PLT 950   Basic Metabolic Panel: Recent Labs  Lab 07/22/21 0532 07/26/21 0829  NA 137 134*  K 3.6 3.5  CL 100 99  CO2 29 26  GLUCOSE 124* 94  BUN 12 12  CREATININE 0.72 0.79  CALCIUM 7.8* 8.1*   GFR: Estimated Creatinine Clearance: 113.1 mL/min (by C-G formula based on SCr of 0.79 mg/dL). Liver Function Tests: No  results for input(s): AST, ALT, ALKPHOS, BILITOT, PROT, ALBUMIN in the last 168 hours.  No results for input(s): LIPASE, AMYLASE in the last 168 hours. No results for input(s): AMMONIA in the last 168 hours. Coagulation Profile: No results for input(s): INR, PROTIME in the last 168 hours. Cardiac Enzymes: Recent Labs  Lab 07/21/21 0858  CKTOTAL 967*   BNP (last 3 results) No results for input(s): PROBNP in the last 8760 hours. HbA1C: No results for input(s): HGBA1C in the last 72 hours. CBG: Recent Labs  Lab 07/26/21 2158 07/27/21 0751 07/27/21 1138 07/27/21 1617 07/27/21 2056  GLUCAP 159* 121* 162* 165* 173*   Lipid Profile: No  results for input(s): CHOL, HDL, LDLCALC, TRIG, CHOLHDL, LDLDIRECT in the last 72 hours. Thyroid Function Tests: No results for input(s): TSH, T4TOTAL, FREET4, T3FREE, THYROIDAB in the last 72 hours. Anemia Panel: No results for input(s): VITAMINB12, FOLATE, FERRITIN, TIBC, IRON, RETICCTPCT in the last 72 hours. Sepsis Labs: Recent Labs  Lab 07/26/21 0829 07/26/21 0837 07/26/21 1038 07/27/21 0530 07/28/21 0520  PROCALCITON 0.15  --   --  0.36 0.32  LATICACIDVEN  --  0.9 0.8  --   --     Recent Results (from the past 240 hour(s))  Resp Panel by RT-PCR (Flu A&B, Covid) Nasopharyngeal Swab     Status: None   Collection Time: 07/20/21  2:16 PM   Specimen: Nasopharyngeal Swab; Nasopharyngeal(NP) swabs in vial transport medium  Result Value Ref Range Status   SARS Coronavirus 2 by RT PCR NEGATIVE NEGATIVE Final    Comment: (NOTE) SARS-CoV-2 target nucleic acids are NOT DETECTED.  The SARS-CoV-2 RNA is generally detectable in upper respiratory specimens during the acute phase of infection. The lowest concentration of SARS-CoV-2 viral copies this assay can detect is 138 copies/mL. A negative result does not preclude SARS-Cov-2 infection and should not be used as the sole basis for treatment or other patient management decisions. A negative result may occur with  improper specimen collection/handling, submission of specimen other than nasopharyngeal swab, presence of viral mutation(s) within the areas targeted by this assay, and inadequate number of viral copies(<138 copies/mL). A negative result must be combined with clinical observations, patient history, and epidemiological information. The expected result is Negative.  Fact Sheet for Patients:  EntrepreneurPulse.com.au  Fact Sheet for Healthcare Providers:  IncredibleEmployment.be  This test is no t yet approved or cleared by the [place] FDA and  has been authorized for detection  and/or diagnosis of SARS-CoV-2 by FDA under an Emergency Use Authorization (EUA). This EUA will remain  in effect (meaning this test can be used) for the duration of the COVID-19 declaration under Section 564(b)(1) of the Act, 21 U.S.C.section 360bbb-3(b)(1), unless the authorization is terminated  or revoked sooner.       Influenza A by PCR NEGATIVE NEGATIVE Final   Influenza B by PCR NEGATIVE NEGATIVE Final    Comment: (NOTE) The Xpert Xpress SARS-CoV-2/FLU/RSV plus assay is intended as an aid in the diagnosis of influenza from Nasopharyngeal swab specimens and should not be used as a sole basis for treatment. Nasal washings and aspirates are unacceptable for Xpert Xpress SARS-CoV-2/FLU/RSV testing.  Fact Sheet for Patients: EntrepreneurPulse.com.au  Fact Sheet for Healthcare Providers: IncredibleEmployment.be  This test is not yet approved or cleared by the [place] FDA and has been authorized for detection and/or diagnosis of SARS-CoV-2 by FDA under an Emergency Use Authorization (EUA). This EUA will remain in effect (meaning this test can be used)  for the duration of the COVID-19 declaration under Section 564(b)(1) of the Act, 21 U.S.C. section 360bbb-3(b)(1), unless the authorization is terminated or revoked.  Performed at [facility], [address] [place], [place] [address]   Blood culture (routine x 2)     Status: None   Collection Time: 07/20/21  4:09 PM   Specimen: BLOOD  Result Value Ref Range Status   Specimen Description BLOOD BLOOD RIGHT HAND  Final   Special Requests   Final    BOTTLES DRAWN AEROBIC AND ANAEROBIC Blood Culture adequate volume   Culture   Final    NO GROWTH 5 DAYS Performed at [facility], [address], [place], [place] [address]    Report Status 07/25/2021 FINAL  Final  Blood culture (routine x 2)     Status: None   Collection Time: 07/20/21  4:15 PM   Specimen: BLOOD   Result Value Ref Range Status   Specimen Description BLOOD RIGHT ANTECUBITAL  Final   Special Requests   Final    BOTTLES DRAWN AEROBIC AND ANAEROBIC Blood Culture adequate volume   Culture   Final    NO GROWTH 5 DAYS Performed at [facility], [address] [place], [place] [address]    Report Status 07/25/2021 FINAL  Final  CULTURE, BLOOD (ROUTINE X 2) w Reflex to ID Panel     Status: None (Preliminary result)   Collection Time: 07/26/21  8:37 AM   Specimen: BLOOD  Result Value Ref Range Status   Specimen Description BLOOD [facility]  Final   Special Requests   Final    BOTTLES DRAWN AEROBIC AND ANAEROBIC Blood Culture adequate volume   Culture   Final    NO GROWTH < 24 HOURS Performed at [facility], [address] [place], [place] [address]    Report Status PENDING  Incomplete  CULTURE, BLOOD (ROUTINE X 2) w Reflex to ID Panel     Status: None (Preliminary result)   Collection Time: 07/26/21  8:44 AM   Specimen: BLOOD  Result Value Ref Range Status   Specimen Description BLOOD [facility]  Final   Special Requests   Final    BOTTLES DRAWN AEROBIC AND ANAEROBIC Blood Culture results may not be optimal due to an excessive volume of blood received in culture bottles   Culture   Final    NO GROWTH < 24 HOURS Performed at [facility], [address] [place], [place] [address]    Report Status PENDING  Incomplete         Radiology Studies: CT CHEST WO CONTRAST  Result Date: 07/27/2021 CLINICAL DATA:  Pneumonia.  Effusion or abscess suspected.  Fever. EXAM: CT CHEST WITHOUT CONTRAST TECHNIQUE: Multidetector CT imaging of the chest was performed following the standard protocol without IV contrast. COMPARISON:  Chest radiography yesterday.  CT abdomen 09/17/2020 FINDINGS: Cardiovascular: Heart size is at the upper limits of normal. No pericardial fluid. No visible coronary artery calcification or aortic atherosclerotic calcification. Mediastinum/Nodes:  Right hilar and paratracheal adenopathy. Index right paratracheal node axial image 42 measures 12 mm in diameter. Lungs/Pleura: Bilateral pleural effusions layering dependently, larger on the right than the left. Associated dependent atelectasis. There is minimal emphysematous change in the upper lobes. There are multiple pulmonary nodules consistent with metastatic disease. Left lower lobe axial image 43 measuring 8 mm. Left lower lobe axial image 75 measuring 6 mm. Left lower lobe axial image 91, 2 lesions measuring 6 mm. Left lower  lobe axial image 96 measuring 2 cm. Right upper lobe axial image 46 measuring 9 mm. Right lung axial image 51 measuring 4 mm. Right lung axial image 59 measuring 5 mm. Right lower lobe axial image 87 measuring 19 mm. Several other smaller nodules in both lungs. Upper Abdomen: No upper abdominal abnormality identified on this study done without contrast. Musculoskeletal: No fracture or focal bone lesion. IMPRESSION: Bilateral effusions layering dependently with dependent atelectasis, slightly larger on the right than the left. Multiple bilateral pulmonary nodules, the largest approximately 2 cm in both lower lobes, quite likely to represent pulmonary metastatic disease. Electronically Signed   By: Nelson Chimes M.D.   On: 07/27/2021 12:55   DG Chest Port 1 View  Result Date: 07/26/2021 CLINICAL DATA:  Fever. EXAM: PORTABLE CHEST 1 VIEW COMPARISON:  July 20, 2021. FINDINGS: Stable cardiomediastinal silhouette. No pneumothorax is noted. Left lung is clear. Mild right basilar subsegmental atelectasis or infiltrate is noted. Bony thorax is unremarkable. IMPRESSION: Mild right basilar subsegmental atelectasis or infiltrate is noted. Electronically Signed   By: Marijo Conception M.D.   On: 07/26/2021 09:30        Scheduled Meds:  apixaban  2.5 mg Oral BID   bicalutamide  50 mg Oral Daily   ferrous sulfate  325 mg Oral QODAY   insulin aspart  0-9 Units Subcutaneous TID WC    insulin aspart protamine- aspart  5 Units Subcutaneous BID WC   liver oil-zinc oxide   Topical TID   metoprolol tartrate  25 mg Oral QAC breakfast   oxybutynin  10 mg Oral QHS   pantoprazole  40 mg Oral Daily   Continuous Infusions:   LOS: 8 days    Time spent: 31 mins     Wyvonnia Dusky, MD [facility] Pager [phone]  If 7PM-7AM, please contact night-coverage 07/28/2021, 7:28 AM

## 2021-07-29 DIAGNOSIS — L89303 Pressure ulcer of unspecified buttock, stage 3: Secondary | ICD-10-CM | POA: Diagnosis not present

## 2021-07-29 DIAGNOSIS — R531 Weakness: Secondary | ICD-10-CM | POA: Diagnosis not present

## 2021-07-29 DIAGNOSIS — I10 Essential (primary) hypertension: Secondary | ICD-10-CM | POA: Diagnosis not present

## 2021-07-29 LAB — GLUCOSE, CAPILLARY
Glucose-Capillary: 123 mg/dL — ABNORMAL HIGH (ref 70–99)
Glucose-Capillary: 149 mg/dL — ABNORMAL HIGH (ref 70–99)
Glucose-Capillary: 143 mg/dL — ABNORMAL HIGH (ref 70–99)
Glucose-Capillary: 170 mg/dL — ABNORMAL HIGH (ref 70–99)

## 2021-07-29 NOTE — Progress Notes (Signed)
PROGRESS NOTE    Christopher Collier  KXF:[id number] DOB: [birth year] DOA: 07/20/2021 PCP: Orpah Greek, DO  Assessment & Plan:   Principal Problem:   Generalized weakness Active Problems:   Diabetes mellitus without complication ([place])   Hypertension   GERD (gastroesophageal reflux disease)   Fall at home, initial encounter   Hypokalemia   Rhabdomyolysis   Decubitus ulcer of buttock, stage 3 (HCC)    Fever: etiology unclear. Afebrile > 72 hours. Blood cxs NGTD. Procal 0.36. 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 Continue fall precautions. Waiting on SNF placement still   Hypokalemia: WNL   IDA: continue on iron supplements    DM2: poorly controlled, HbA1c 10.6 in May 2022. Continue on 70/30, SSI w/ accuchecks    Stage III decubitus ulcers on bilateral buttocks: w/ deep tissue injury on the sacrum. Continue local wound care.     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 W/ mets as per pt. Pt was previously aware of pulmonary nodules as per pt. Management per onco as an outpatient    Minimally displaced right distal clavicular fracture: outpatient f/u w/ ortho surg      DVT prophylaxis: eliquis  Code Status: full  Family Communication: Disposition Plan: CM is still working on SNF placement   Level of care: Med-Surg  Status is: Inpatient  Remains inpatient appropriate because: medically stable for d/c and waiting on SNF placement still      Consultants:    Procedures:   Antimicrobials:   Subjective: Pt denies any complaints  Objective: Vitals:   07/28/21 1208 07/28/21 1655 07/28/21 2123 07/29/21 0459  BP: (!) 105/57 127/69 (!) 129/58 134/67  Pulse: 66 65 67 67  Resp: 18 16 18 16   Temp: 98 F (36.7 C) 98.8 F (37.1 C) 98.3 F  (36.8 C) 97.9 F (36.6 C)  TempSrc: Oral Oral Oral   SpO2: 96% 95% 96% 96%  Weight:      Height:        Intake/Output Summary (Last 24 hours) at 07/29/2021 0723 Last data filed at 07/29/2021 0600 Gross per 24 hour  Intake 240 ml  Output 2402 ml  Net -2162 ml   Filed Weights   07/20/21 1412 07/22/21 2110  Weight: 86.2 kg 133.8 kg    Examination:  General exam: Appears calm & comfortable  Respiratory system: clear breath sounds b/l.  Cardiovascular system: S1 & S2+. No rubs or clicks  Gastrointestinal system: Abd is soft, NT, obese & normal bowel sounds  Central nervous system: alert and oriented. Moves all extremities   Psychiatry: Judgement and insight appear normal. Flat mood and affect     Data Reviewed: I have personally reviewed following labs and imaging studies  CBC: Recent Labs  Lab 07/27/21 0530  WBC 8.3  NEUTROABS 6.5  HGB 9.9*  HCT 30.8*  MCV 87.5  PLT 696   Basic Metabolic Panel: Recent Labs  Lab 07/26/21 0829  NA 134*  K 3.5  CL 99  CO2 26  GLUCOSE 94  BUN 12  CREATININE 0.79  CALCIUM 8.1*   GFR: Estimated Creatinine Clearance: 113.1 mL/min (by C-G formula based on SCr of 0.79 mg/dL). Liver Function Tests: No results for input(s): AST, ALT, ALKPHOS, BILITOT, PROT, ALBUMIN in the last 168 hours.  No  results for input(s): LIPASE, AMYLASE in the last 168 hours. No results for input(s): AMMONIA in the last 168 hours. Coagulation Profile: No results for input(s): INR, PROTIME in the last 168 hours. Cardiac Enzymes: No results for input(s): CKTOTAL, CKMB, CKMBINDEX, TROPONINI in the last 168 hours.  BNP (last 3 results) No results for input(s): PROBNP in the last 8760 hours. HbA1C: No results for input(s): HGBA1C in the last 72 hours. CBG: Recent Labs  Lab 07/27/21 2056 07/28/21 0812 07/28/21 1206 07/28/21 1655 07/28/21 2124  GLUCAP 173* 109* 153* 108* 114*   Lipid Profile: No results for input(s): CHOL, HDL, LDLCALC, TRIG,  CHOLHDL, LDLDIRECT in the last 72 hours. Thyroid Function Tests: No results for input(s): TSH, T4TOTAL, FREET4, T3FREE, THYROIDAB in the last 72 hours. Anemia Panel: No results for input(s): VITAMINB12, FOLATE, FERRITIN, TIBC, IRON, RETICCTPCT in the last 72 hours. Sepsis Labs: Recent Labs  Lab 07/26/21 0829 07/26/21 0837 07/26/21 1038 07/27/21 0530 07/28/21 0520  PROCALCITON 0.15  --   --  0.36 0.32  LATICACIDVEN  --  0.9 0.8  --   --     Recent Results (from the past 240 hour(s))  Resp Panel by RT-PCR (Flu A&B, Covid) Nasopharyngeal Swab     Status: None   Collection Time: 07/20/21  2:16 PM   Specimen: Nasopharyngeal Swab; Nasopharyngeal(NP) swabs in vial transport medium  Result Value Ref Range Status   SARS Coronavirus 2 by RT PCR NEGATIVE NEGATIVE Final    Comment: (NOTE) SARS-CoV-2 target nucleic acids are NOT DETECTED.  The SARS-CoV-2 RNA is generally detectable in upper respiratory specimens during the acute phase of infection. The lowest concentration of SARS-CoV-2 viral copies this assay can detect is 138 copies/mL. A negative result does not preclude SARS-Cov-2 infection and should not be used as the sole basis for treatment or other patient management decisions. A negative result may occur with  improper specimen collection/handling, submission of specimen other than nasopharyngeal swab, presence of viral mutation(s) within the areas targeted by this assay, and inadequate number of viral copies(<138 copies/mL). A negative result must be combined with clinical observations, patient history, and epidemiological information. The expected result is Negative.  Fact Sheet for Patients:  EntrepreneurPulse.com.au  Fact Sheet for Healthcare Providers:  IncredibleEmployment.be  This test is no t yet approved or cleared by the [place] FDA and  has been authorized for detection and/or diagnosis of SARS-CoV-2 by FDA under an  Emergency Use Authorization (EUA). This EUA will remain  in effect (meaning this test can be used) for the duration of the COVID-19 declaration under Section 564(b)(1) of the Act, 21 U.S.C.section 360bbb-3(b)(1), unless the authorization is terminated  or revoked sooner.       Influenza A by PCR NEGATIVE NEGATIVE Final   Influenza B by PCR NEGATIVE NEGATIVE Final    Comment: (NOTE) The Xpert Xpress SARS-CoV-2/FLU/RSV plus assay is intended as an aid in the diagnosis of influenza from Nasopharyngeal swab specimens and should not be used as a sole basis for treatment. Nasal washings and aspirates are unacceptable for Xpert Xpress SARS-CoV-2/FLU/RSV testing.  Fact Sheet for Patients: EntrepreneurPulse.com.au  Fact Sheet for Healthcare Providers: IncredibleEmployment.be  This test is not yet approved or cleared by the [place] FDA and has been authorized for detection and/or diagnosis of SARS-CoV-2 by FDA under an Emergency Use Authorization (EUA). This EUA will remain in effect (meaning this test can be used) for the duration of the COVID-19 declaration under Section 564(b)(1) of the Act,  21 U.S.C. section 360bbb-3(b)(1), unless the authorization is terminated or revoked.  Performed at [facility], [address] [place], [place] [address]   Blood culture (routine x 2)     Status: None   Collection Time: 07/20/21  4:09 PM   Specimen: BLOOD  Result Value Ref Range Status   Specimen Description BLOOD BLOOD RIGHT HAND  Final   Special Requests   Final    BOTTLES DRAWN AEROBIC AND ANAEROBIC Blood Culture adequate volume   Culture   Final    NO GROWTH 5 DAYS Performed at [facility], [address] [place], [place] [address]    Report Status 07/25/2021 FINAL  Final  Blood culture (routine x 2)     Status: None   Collection Time: 07/20/21  4:15 PM   Specimen: BLOOD  Result Value Ref Range Status   Specimen  Description BLOOD RIGHT ANTECUBITAL  Final   Special Requests   Final    BOTTLES DRAWN AEROBIC AND ANAEROBIC Blood Culture adequate volume   Culture   Final    NO GROWTH 5 DAYS Performed at [facility], [address] [place], [place] [address]    Report Status 07/25/2021 FINAL  Final  CULTURE, BLOOD (ROUTINE X 2) w Reflex to ID Panel     Status: None (Preliminary result)   Collection Time: 07/26/21  8:37 AM   Specimen: BLOOD  Result Value Ref Range Status   Specimen Description BLOOD [facility]  Final   Special Requests   Final    BOTTLES DRAWN AEROBIC AND ANAEROBIC Blood Culture adequate volume   Culture   Final    NO GROWTH 3 DAYS Performed at [facility], [address], [place], [place] [address]    Report Status PENDING  Incomplete  CULTURE, BLOOD (ROUTINE X 2) w Reflex to ID Panel     Status: None (Preliminary result)   Collection Time: 07/26/21  8:44 AM   Specimen: BLOOD  Result Value Ref Range Status   Specimen Description BLOOD [facility]  Final   Special Requests   Final    BOTTLES DRAWN AEROBIC AND ANAEROBIC Blood Culture results may not be optimal due to an excessive volume of blood received in culture bottles   Culture   Final    NO GROWTH 3 DAYS Performed at [facility], [address], [place], [place] [address]    Report Status PENDING  Incomplete         Radiology Studies: CT CHEST WO CONTRAST  Result Date: 07/27/2021 CLINICAL DATA:  Pneumonia.  Effusion or abscess suspected.  Fever. EXAM: CT CHEST WITHOUT CONTRAST TECHNIQUE: Multidetector CT imaging of the chest was performed following the standard protocol without IV contrast. COMPARISON:  Chest radiography yesterday.  CT abdomen 09/17/2020 FINDINGS: Cardiovascular: Heart size is at the upper limits of normal. No pericardial fluid. No visible coronary artery calcification or aortic atherosclerotic calcification. Mediastinum/Nodes: Right hilar and paratracheal adenopathy. Index right  paratracheal node axial image 42 measures 12 mm in diameter. Lungs/Pleura: Bilateral pleural effusions layering dependently, larger on the right than the left. Associated dependent atelectasis. There is minimal emphysematous change in the upper lobes. There are multiple pulmonary nodules consistent with metastatic disease. Left lower lobe axial image 43 measuring 8 mm. Left lower lobe axial image 75 measuring 6 mm. Left lower lobe axial image 91, 2 lesions measuring 6 mm. Left lower lobe axial image 96 measuring 2 cm. Right upper lobe axial image 46 measuring 9  mm. Right lung axial image 51 measuring 4 mm. Right lung axial image 59 measuring 5 mm. Right lower lobe axial image 87 measuring 19 mm. Several other smaller nodules in both lungs. Upper Abdomen: No upper abdominal abnormality identified on this study done without contrast. Musculoskeletal: No fracture or focal bone lesion. IMPRESSION: Bilateral effusions layering dependently with dependent atelectasis, slightly larger on the right than the left. Multiple bilateral pulmonary nodules, the largest approximately 2 cm in both lower lobes, quite likely to represent pulmonary metastatic disease. Electronically Signed   By: Nelson Chimes M.D.   On: 07/27/2021 12:55        Scheduled Meds:  apixaban  2.5 mg Oral BID   bicalutamide  50 mg Oral Daily   ferrous sulfate  325 mg Oral QODAY   insulin aspart  0-9 Units Subcutaneous TID WC   insulin aspart protamine- aspart  5 Units Subcutaneous BID WC   liver oil-zinc oxide   Topical TID   metoprolol tartrate  25 mg Oral QAC breakfast   oxybutynin  10 mg Oral QHS   pantoprazole  40 mg Oral Daily   Continuous Infusions:   LOS: 9 days    Time spent: 15 mins     Wyvonnia Dusky, MD [facility] Pager [phone]  If 7PM-7AM, please contact night-coverage 07/29/2021, 7:23 AM

## 2021-07-29 NOTE — Progress Notes (Signed)
PT Cancellation Note  Patient Details Name: Christopher Collier MRN: [id number] DOB: Aug 15, 1946   Cancelled Treatment:     PT attempt. Pt currently unwilling to participate. C/o stomach pain and needing to have BM. Offer to get him to [facility] however pt remained unwilling. Acute PT will continue to follow and progress as able per current POC.    Willette Pa 07/29/2021, 1:53 PM

## 2021-07-29 NOTE — TOC Progression Note (Signed)
Transition of Care [facility]) - Progression Note    Patient Details  Name: Christopher Collier MRN: [id number] Date of Birth: [birth year]  Transition of Care [facility]) CM/SW [place], RN Phone Number: 07/29/2021, 4:19 PM  Clinical Narrative:   Two messages left for [facility] admissions, awaiting call back, TOC to follow to discharge.    Expected Discharge Plan: [place] Barriers to Discharge: Continued Medical Work up  Expected Discharge Plan and Services Expected Discharge Plan: [place]   Discharge Planning Services: CM Consult Post Acute Care Choice: [place] Living arrangements for the past 2 months: Single Family Home                 DME Arranged:  (anticipated to go to SNF)         HH Arranged:  (Anticipated DC to sNF)           Social Determinants of Health (SDOH) Interventions    Readmission Risk Interventions No flowsheet data found.

## 2021-07-30 DIAGNOSIS — R531 Weakness: Secondary | ICD-10-CM | POA: Diagnosis not present

## 2021-07-30 DIAGNOSIS — I10 Essential (primary) hypertension: Secondary | ICD-10-CM | POA: Diagnosis not present

## 2021-07-30 DIAGNOSIS — L89303 Pressure ulcer of unspecified buttock, stage 3: Secondary | ICD-10-CM | POA: Diagnosis not present

## 2021-07-30 LAB — BASIC METABOLIC PANEL
Anion gap: 8 (ref 5–15)
BUN: 11 mg/dL (ref 8–23)
CO2: 26 mmol/L (ref 22–32)
Calcium: 8.3 mg/dL — ABNORMAL LOW (ref 8.9–10.3)
Chloride: 99 mmol/L (ref 98–111)
Creatinine, Ser: 0.78 mg/dL (ref 0.61–1.24)
GFR, Estimated: >60 mL/min (ref >60)
Glucose, Bld: 129 mg/dL — ABNORMAL HIGH (ref 70–99)
Potassium: 4 mmol/L (ref 3.5–5.1)
Sodium: 133 mmol/L — ABNORMAL LOW (ref 135–145)

## 2021-07-30 LAB — CBC
HCT: 31.8 % — ABNORMAL LOW (ref 39.0–52.0)
Hemoglobin: 9.8 g/dL — ABNORMAL LOW (ref 13.0–17.0)
MCH: 27.4 pg (ref 26.0–34.0)
MCHC: 30.8 g/dL (ref 30.0–36.0)
MCV: 88.8 fL (ref 80.0–100.0)
Platelets: 431 10*3/uL — ABNORMAL HIGH (ref 150–400)
RBC: 3.58 MIL/uL — ABNORMAL LOW (ref 4.22–5.81)
RDW: 15.6 % — ABNORMAL HIGH (ref 11.5–15.5)
WBC: 8.6 10*3/uL (ref 4.0–10.5)
nRBC: 0 % (ref 0.0–0.2)

## 2021-07-30 LAB — GLUCOSE, CAPILLARY
Glucose-Capillary: 155 mg/dL — ABNORMAL HIGH (ref 70–99)
Glucose-Capillary: 150 mg/dL — ABNORMAL HIGH (ref 70–99)
Glucose-Capillary: 134 mg/dL — ABNORMAL HIGH (ref 70–99)
Glucose-Capillary: 104 mg/dL — ABNORMAL HIGH (ref 70–99)

## 2021-07-30 NOTE — Progress Notes (Addendum)
PROGRESS NOTE    Christopher Collier  SWF:[id number] DOB: [birth year] DOA: 07/20/2021 PCP: Orpah Greek, DO  Assessment & Plan:   Principal Problem:   Generalized weakness Active Problems:   Diabetes mellitus without complication ([place])   Hypertension   GERD (gastroesophageal reflux disease)   Fall at home, initial encounter   Hypokalemia   Rhabdomyolysis   Decubitus ulcer of buttock, stage 3 (HCC)    Fever: etiology unclear. Afebrile > 72 hours. Blood cxs NGTD. Procal 0.36. 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 Continue fall precautions. 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 Continue on 70/30, SSI w/ accuchecks    Stage III decubitus ulcers on bilateral buttocks: w/ deep tissue injury on the sacrum. 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 W/ mets as per pt. Pt was previously aware of pulmonary nodules as per pt. Management per onco as an outpatient    Minimally displaced right distal clavicular fracture: outpatient f/u w/ ortho surg   Thrombocytosis: etiology unclear, likely reactive. Will continue to monitor intermittently      DVT prophylaxis: eliquis  Code Status: full  Family Communication: Disposition Plan: waiting on SNF placement still    Level of care: Med-Surg  Status is: Inpatient  Remains inpatient appropriate because: medically stable for d/c and waiting on SNF placement still      Consultants:    Procedures:   Antimicrobials:   Subjective: Pt denies any complaints   Objective: Vitals:   07/29/21 1148 07/29/21 1658 07/29/21 2018 07/30/21 0509  BP: 124/64 131/67 124/64 (!) 126/56  Pulse: 66 64 66 73   Resp: 18 20 16 16   Temp: 98.5 F (36.9 C) 98 F (36.7 C) 97.9 F (36.6 C) 98.3 F (36.8 C)  TempSrc: Oral Oral Oral Oral  SpO2: 96% 95% 100% 99%  Weight:      Height:        Intake/Output Summary (Last 24 hours) at 07/30/2021 2202 Last data filed at 07/30/2021 0500 Gross per 24 hour  Intake --  Output 1200 ml  Net -1200 ml   Filed Weights   07/20/21 1412 07/22/21 2110  Weight: 86.2 kg 133.8 kg    Examination:  General exam: Appears comfortable  Respiratory system: clear breath sounds b/l  Cardiovascular system: S1/S2+. No rubs or clicks  Gastrointestinal system: Abd is soft, NT, ND & normal bowel sounds  Central nervous system: Alert and oriented. Moves all extremities  Psychiatry: Judgement and insight appear normal. Flat mood and affect      Data Reviewed: I have personally reviewed following labs and imaging studies  CBC: Recent Labs  Lab 07/27/21 0530 07/30/21 0501  WBC 8.3 8.6  NEUTROABS 6.5  --   HGB 9.9* 9.8*  HCT 30.8* 31.8*  MCV 87.5 88.8  PLT 336 542*   Basic Metabolic Panel: Recent Labs  Lab 07/26/21 0829 07/30/21 0501  NA 134* 133*  K 3.5 4.0  CL 99 99  CO2 26 26  GLUCOSE 94 129*  BUN 12 11  CREATININE 0.79 0.78  CALCIUM 8.1* 8.3*   GFR: Estimated Creatinine Clearance: 113.1 mL/min (by C-G formula based on SCr of 0.78  mg/dL). Liver Function Tests: No results for input(s): AST, ALT, ALKPHOS, BILITOT, PROT, ALBUMIN in the last 168 hours.  No results for input(s): LIPASE, AMYLASE in the last 168 hours. No results for input(s): AMMONIA in the last 168 hours. Coagulation Profile: No results for input(s): INR, PROTIME in the last 168 hours. Cardiac Enzymes: No results for input(s): CKTOTAL, CKMB, CKMBINDEX, TROPONINI in the last 168 hours.  BNP (last 3 results) No results for input(s): PROBNP in the last 8760 hours. HbA1C: No results for input(s): HGBA1C in the last 72 hours. CBG: Recent Labs  Lab 07/28/21 2124 07/29/21 0736  07/29/21 1149 07/29/21 1620 07/29/21 2021  GLUCAP 114* 123* 149* 143* 170*   Lipid Profile: No results for input(s): CHOL, HDL, LDLCALC, TRIG, CHOLHDL, LDLDIRECT in the last 72 hours. Thyroid Function Tests: No results for input(s): TSH, T4TOTAL, FREET4, T3FREE, THYROIDAB in the last 72 hours. Anemia Panel: No results for input(s): VITAMINB12, FOLATE, FERRITIN, TIBC, IRON, RETICCTPCT in the last 72 hours. Sepsis Labs: Recent Labs  Lab 07/26/21 0829 07/26/21 0837 07/26/21 1038 07/27/21 0530 07/28/21 0520  PROCALCITON 0.15  --   --  0.36 0.32  LATICACIDVEN  --  0.9 0.8  --   --     Recent Results (from the past 240 hour(s))  Resp Panel by RT-PCR (Flu A&B, Covid) Nasopharyngeal Swab     Status: None   Collection Time: 07/20/21  2:16 PM   Specimen: Nasopharyngeal Swab; Nasopharyngeal(NP) swabs in vial transport medium  Result Value Ref Range Status   SARS Coronavirus 2 by RT PCR NEGATIVE NEGATIVE Final    Comment: (NOTE) SARS-CoV-2 target nucleic acids are NOT DETECTED.  The SARS-CoV-2 RNA is generally detectable in upper respiratory specimens during the acute phase of infection. The lowest concentration of SARS-CoV-2 viral copies this assay can detect is 138 copies/mL. A negative result does not preclude SARS-Cov-2 infection and should not be used as the sole basis for treatment or other patient management decisions. A negative result may occur with  improper specimen collection/handling, submission of specimen other than nasopharyngeal swab, presence of viral mutation(s) within the areas targeted by this assay, and inadequate number of viral copies(<138 copies/mL). A negative result must be combined with clinical observations, patient history, and epidemiological information. The expected result is Negative.  Fact Sheet for Patients:  EntrepreneurPulse.com.au  Fact Sheet for Healthcare Providers:  IncredibleEmployment.be  This test  is no t yet approved or cleared by the [place] FDA and  has been authorized for detection and/or diagnosis of SARS-CoV-2 by FDA under an Emergency Use Authorization (EUA). This EUA will remain  in effect (meaning this test can be used) for the duration of the COVID-19 declaration under Section 564(b)(1) of the Act, 21 U.S.C.section 360bbb-3(b)(1), unless the authorization is terminated  or revoked sooner.       Influenza A by PCR NEGATIVE NEGATIVE Final   Influenza B by PCR NEGATIVE NEGATIVE Final    Comment: (NOTE) The Xpert Xpress SARS-CoV-2/FLU/RSV plus assay is intended as an aid in the diagnosis of influenza from Nasopharyngeal swab specimens and should not be used as a sole basis for treatment. Nasal washings and aspirates are unacceptable for Xpert Xpress SARS-CoV-2/FLU/RSV testing.  Fact Sheet for Patients: EntrepreneurPulse.com.au  Fact Sheet for Healthcare Providers: IncredibleEmployment.be  This test is not yet approved or cleared by the [place] FDA and has been authorized for detection and/or diagnosis of SARS-CoV-2 by FDA under an Emergency Use Authorization (EUA). This EUA will remain  in effect (meaning this test can be used) for the duration of the COVID-19 declaration under Section 564(b)(1) of the Act, 21 U.S.C. section 360bbb-3(b)(1), unless the authorization is terminated or revoked.  Performed at [facility], [address] [place], [place] [address]   Blood culture (routine x 2)     Status: None   Collection Time: 07/20/21  4:09 PM   Specimen: BLOOD  Result Value Ref Range Status   Specimen Description BLOOD BLOOD RIGHT HAND  Final   Special Requests   Final    BOTTLES DRAWN AEROBIC AND ANAEROBIC Blood Culture adequate volume   Culture   Final    NO GROWTH 5 DAYS Performed at [facility], [address], [place], [place] [address]    Report Status 07/25/2021 FINAL  Final  Blood  culture (routine x 2)     Status: None   Collection Time: 07/20/21  4:15 PM   Specimen: BLOOD  Result Value Ref Range Status   Specimen Description BLOOD RIGHT ANTECUBITAL  Final   Special Requests   Final    BOTTLES DRAWN AEROBIC AND ANAEROBIC Blood Culture adequate volume   Culture   Final    NO GROWTH 5 DAYS Performed at [facility], [address] [place], [place] [address]    Report Status 07/25/2021 FINAL  Final  CULTURE, BLOOD (ROUTINE X 2) w Reflex to ID Panel     Status: None (Preliminary result)   Collection Time: 07/26/21  8:37 AM   Specimen: BLOOD  Result Value Ref Range Status   Specimen Description BLOOD [facility]  Final   Special Requests   Final    BOTTLES DRAWN AEROBIC AND ANAEROBIC Blood Culture adequate volume   Culture   Final    NO GROWTH 4 DAYS Performed at [facility], [address], [place], [place] [address]    Report Status PENDING  Incomplete  CULTURE, BLOOD (ROUTINE X 2) w Reflex to ID Panel     Status: None (Preliminary result)   Collection Time: 07/26/21  8:44 AM   Specimen: BLOOD  Result Value Ref Range Status   Specimen Description BLOOD [facility]  Final   Special Requests   Final    BOTTLES DRAWN AEROBIC AND ANAEROBIC Blood Culture results may not be optimal due to an excessive volume of blood received in culture bottles   Culture   Final    NO GROWTH 4 DAYS Performed at [facility], [address] [place],  [address]    Report Status PENDING  Incomplete         Radiology Studies: No results found.      Scheduled Meds:  apixaban  2.5 mg Oral BID   bicalutamide  50 mg Oral Daily   ferrous sulfate  325 mg Oral QODAY   insulin aspart  0-9 Units Subcutaneous TID WC   insulin aspart protamine- aspart  5 Units Subcutaneous BID WC   liver oil-zinc oxide   Topical TID   metoprolol tartrate  25 mg Oral QAC breakfast   oxybutynin  10 mg Oral QHS   pantoprazole  40 mg Oral Daily   Continuous  Infusions:   LOS: 10 days    Time spent: 15 mins     Wyvonnia Dusky, MD [facility] Pager [phone]  If 7PM-7AM, please contact night-coverage 07/30/2021, 6:37 AM

## 2021-07-31 DIAGNOSIS — R531 Weakness: Secondary | ICD-10-CM | POA: Diagnosis not present

## 2021-07-31 DIAGNOSIS — I10 Essential (primary) hypertension: Secondary | ICD-10-CM | POA: Diagnosis not present

## 2021-07-31 DIAGNOSIS — L89303 Pressure ulcer of unspecified buttock, stage 3: Secondary | ICD-10-CM | POA: Diagnosis not present

## 2021-07-31 LAB — CULTURE, BLOOD (ROUTINE X 2)
Culture: NO GROWTH
Culture: NO GROWTH
Special Requests: ADEQUATE

## 2021-07-31 LAB — GLUCOSE, CAPILLARY
Glucose-Capillary: 80 mg/dL (ref 70–99)
Glucose-Capillary: 112 mg/dL — ABNORMAL HIGH (ref 70–99)
Glucose-Capillary: 147 mg/dL — ABNORMAL HIGH (ref 70–99)
Glucose-Capillary: 204 mg/dL — ABNORMAL HIGH (ref 70–99)

## 2021-07-31 NOTE — Progress Notes (Signed)
PROGRESS NOTE    Christopher CAPPIELLO  RDE:[id number] DOB: 1946/02/01 DOA: 07/20/2021 PCP: Orpah Greek, DO  Assessment & Plan:   Principal Problem:   Generalized weakness Active Problems:   Diabetes mellitus without complication ([place])   Hypertension   GERD (gastroesophageal reflux disease)   Fall at home, initial encounter   Hypokalemia   Rhabdomyolysis   Decubitus ulcer of buttock, stage 3 (HCC)    Fever: etiology unclear. Afebrile > 72 hours. Blood cxs NGTD. Procal 0.36. 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 Continue on fall precautions. 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 Continue on 70/30, SSI w/ accuchecks    Stage III decubitus ulcers on bilateral buttocks: w/ deep tissue injury on the sacrum. 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 W/ mets as per pt. Pt was previously aware of pulmonary nodules as per pt. Management per onco as an outpatient    Minimally displaced right distal clavicular fracture: will f/u outpatient w/ ortho surg   Thrombocytosis: etiology unclear, likely reactive. Will continue to monitor intermittently      DVT prophylaxis: eliquis  Code Status: full  Family Communication: Disposition Plan: waiting on SNF placement still    Level of care: Med-Surg  Status is: Inpatient  Remains inpatient appropriate because: medically stable for d/c and waiting on SNF placement still      Consultants:    Procedures:   Antimicrobials:   Subjective: Pt c/o fatigue   Objective: Vitals:   07/30/21 1239 07/30/21 1533 07/30/21 2043 07/31/21 0456  BP: 140/77 (!) 142/69 134/69 (!) 142/70   Pulse: 67 69 70 74  Resp: 18 16 20 16   Temp: 98.1 F (36.7 C) 98.2 F (36.8 C) 99.2 F (37.3 C) 99.1 F (37.3 C)  TempSrc: Oral Oral Oral Oral  SpO2: 98% 98% 99% 96%  Weight:      Height:        Intake/Output Summary (Last 24 hours) at 07/31/2021 0724 Last data filed at 07/31/2021 0500 Gross per 24 hour  Intake 180 ml  Output 950 ml  Net -770 ml   Filed Weights   07/20/21 1412 07/22/21 2110  Weight: 86.2 kg 133.8 kg    Examination:  General exam: appears calm & comfortable   Respiratory system: clear breath sounds b/l. No rhonchi, wheezes Cardiovascular system: S1 & S2+. No rubs or clicks  Gastrointestinal system: Abd is soft, NT, ND & normal bowel sounds  Central nervous system: Alert and oriented. Moves all extremities  Psychiatry: Judgement and insight appear normal. Flat mood and affect   Data Reviewed: I have personally reviewed following labs and imaging studies  CBC: Recent Labs  Lab 07/27/21 0530 07/30/21 0501  WBC 8.3 8.6  NEUTROABS 6.5  --   HGB 9.9* 9.8*  HCT 30.8* 31.8*  MCV 87.5 88.8  PLT 336 631*   Basic Metabolic Panel: Recent Labs  Lab 07/26/21 0829 07/30/21 0501  NA 134* 133*  K 3.5 4.0  CL 99 99  CO2 26 26  GLUCOSE 94 129*  BUN 12 11  CREATININE 0.79 0.78  CALCIUM 8.1* 8.3*   GFR: Estimated Creatinine Clearance:  113.1 mL/min (by C-G formula based on SCr of 0.78 mg/dL). Liver Function Tests: No results for input(s): AST, ALT, ALKPHOS, BILITOT, PROT, ALBUMIN in the last 168 hours.  No results for input(s): LIPASE, AMYLASE in the last 168 hours. No results for input(s): AMMONIA in the last 168 hours. Coagulation Profile: No results for input(s): INR, PROTIME in the last 168 hours. Cardiac Enzymes: No results for input(s): CKTOTAL, CKMB, CKMBINDEX, TROPONINI in the last 168 hours.  BNP (last 3 results) No results for input(s): PROBNP in the last 8760 hours. HbA1C: No results for input(s): HGBA1C in the last 72  hours. CBG: Recent Labs  Lab 07/29/21 2021 07/30/21 0823 07/30/21 1238 07/30/21 1535 07/30/21 2047  GLUCAP 170* 155* 150* 134* 104*   Lipid Profile: No results for input(s): CHOL, HDL, LDLCALC, TRIG, CHOLHDL, LDLDIRECT in the last 72 hours. Thyroid Function Tests: No results for input(s): TSH, T4TOTAL, FREET4, T3FREE, THYROIDAB in the last 72 hours. Anemia Panel: No results for input(s): VITAMINB12, FOLATE, FERRITIN, TIBC, IRON, RETICCTPCT in the last 72 hours. Sepsis Labs: Recent Labs  Lab 07/26/21 0829 07/26/21 0837 07/26/21 1038 07/27/21 0530 07/28/21 0520  PROCALCITON 0.15  --   --  0.36 0.32  LATICACIDVEN  --  0.9 0.8  --   --     Recent Results (from the past 240 hour(s))  CULTURE, BLOOD (ROUTINE X 2) w Reflex to ID Panel     Status: None   Collection Time: 07/26/21  8:37 AM   Specimen: BLOOD  Result Value Ref Range Status   Specimen Description BLOOD [facility]  Final   Special Requests   Final    BOTTLES DRAWN AEROBIC AND ANAEROBIC Blood Culture adequate volume   Culture   Final    NO GROWTH 5 DAYS Performed at [facility], [address] [place], [place] [address]    Report Status 07/31/2021 FINAL  Final  CULTURE, BLOOD (ROUTINE X 2) w Reflex to ID Panel     Status: None   Collection Time: 07/26/21  8:44 AM   Specimen: BLOOD  Result Value Ref Range Status   Specimen Description BLOOD [facility]  Final   Special Requests   Final    BOTTLES DRAWN AEROBIC AND ANAEROBIC Blood Culture results may not be optimal due to an excessive volume of blood received in culture bottles   Culture   Final    NO GROWTH 5 DAYS Performed at [facility], [address] [place], [place] [address]    Report Status 07/31/2021 FINAL  Final         Radiology Studies: No results found.      Scheduled Meds:  apixaban  2.5 mg Oral BID   bicalutamide  50 mg Oral Daily   ferrous sulfate  325 mg Oral QODAY   insulin aspart  0-9 Units Subcutaneous TID WC    insulin aspart protamine- aspart  5 Units Subcutaneous BID WC   liver oil-zinc oxide   Topical TID   metoprolol tartrate  25 mg Oral QAC breakfast   oxybutynin  10 mg Oral QHS   pantoprazole  40 mg Oral Daily   Continuous Infusions:   LOS: 11 days    Time spent: 15 mins     Wyvonnia Dusky, MD [facility] Pager [phone]  If 7PM-7AM, please contact night-coverage 07/31/2021, 7:24 AM

## 2021-08-01 DIAGNOSIS — I10 Essential (primary) hypertension: Secondary | ICD-10-CM | POA: Diagnosis not present

## 2021-08-01 DIAGNOSIS — L89303 Pressure ulcer of unspecified buttock, stage 3: Secondary | ICD-10-CM | POA: Diagnosis not present

## 2021-08-01 DIAGNOSIS — R531 Weakness: Secondary | ICD-10-CM | POA: Diagnosis not present

## 2021-08-01 LAB — GLUCOSE, CAPILLARY
Glucose-Capillary: 125 mg/dL — ABNORMAL HIGH (ref 70–99)
Glucose-Capillary: 128 mg/dL — ABNORMAL HIGH (ref 70–99)
Glucose-Capillary: 100 mg/dL — ABNORMAL HIGH (ref 70–99)
Glucose-Capillary: 108 mg/dL — ABNORMAL HIGH (ref 70–99)

## 2021-08-01 NOTE — TOC Progression Note (Signed)
Transition of Care [facility]) - Progression Note    Patient Details  Name: Christopher Collier MRN: [id number] Date of Birth: 02/03/1946  Transition of Care [facility]) CM/SW [place], RN Phone Number: 08/01/2021, 3:14 PM  Clinical Narrative:   Contacted facilities in network with [employer] as follows: [place] [facility] [place] [phone] and business mgr [phone] [place] left message [address]-left message [facility] [place]-left message [facility]-not in network [facility]-no beds [facility] nursing left message    Expected Discharge Plan: [place] Barriers to Discharge: Continued Medical Work up  [employer] and Services Expected Discharge Plan: [place]   Discharge Planning Services: [employer] Consult Post Acute Care Choice: [place] Living arrangements for the past 2 months: Single Family Home                 DME Arranged:  (anticipated to go to SNF)         HH Arranged:  (Anticipated DC to sNF)           Social Determinants of Health (SDOH) Interventions    Readmission Risk Interventions No flowsheet data found.

## 2021-08-01 NOTE — Progress Notes (Addendum)
Physical Therapy Re-evaluation Patient Details Name: Christopher Collier MRN: [id number] DOB: 25-Oct-1945 Today's Date: 08/01/2021   History of Present Illness Pt is a 75 y.o. male presenting to hospital 10/26 with generalized weakness (pt laid on floor on Monday and unable to get up since then).  Pt addmited with generalized weaknes/possible fall? (per chart pt fell 3 weeks ago and has R clavicular fx 06/28/21), hypokalemia, DM, and stage III decubitus ulcer with surrounding cellulitis.  PMH includes incontinence, DM, DVT, htn, MRSA, sleep apnea, UTI, and h/o prostate CA.    PT Comments    Pt seated EOB working with OT, PT/OT continued w/ co-treat to address functional mobility. Pt required MAX-A +2 STS from elevated bed surface. Once standing able to support weight w/ UE on RW. Pt is unable to fully extend hips but was able to achieve a little more with trunk extension with facilitory cues this session compared to previous. Pt also required less physical assist bed > chair and was able to slide feet independently w/ MIN A, RW. SNF remains primary discharge recommendations at this time. Skilled PT intervention is indicated to address deficits in function, mobility, and to return to PLOF as able.     Recommendations for follow up therapy are one component of a multi-disciplinary discharge planning process, led by the attending physician.  Recommendations may be updated based on patient status, additional functional criteria and insurance authorization.  Follow Up Recommendations  Skilled nursing-short term rehab (<3 hours/day)     Assistance Recommended at Discharge Frequent or constant Supervision/Assistance  Equipment Recommendations  Other (comment) (TBD next venu of care)    Recommendations for Other Services       Precautions / Restrictions Precautions Precautions: Fall Restrictions Weight Bearing Restrictions: Yes RUE Weight Bearing: Weight bearing as tolerated Other Position/Activity  Restrictions: Per Dr. Steva Colder ([place]) note 07/18/21 (orthopaedic f/u visit): "I encouraged him to begin gently actively using his right upper extremity as tolerated."     Mobility  Bed Mobility               General bed mobility comments: Pt seated EOB working with OT, left in recliner end of session    Transfers Overall transfer level: Needs assistance Equipment used: Rolling walker (2 wheels) Transfers: Sit to/from Stand Sit to Stand: Max assist;+2 physical assistance;From elevated surface                Ambulation/Gait Ambulation/Gait assistance: Herbalist (Feet): 1 Feet Assistive device: Rolling walker (2 wheels) Gait Pattern/deviations: Step-to pattern Gait velocity: decreased     General Gait Details: MIN-A for RW and occassional assist for shifting weight to R side, improvement in shuffling feet wtihout assistance this session   Stairs             Wheelchair Mobility    Modified Rankin (Stroke Patients Only)       Balance Overall balance assessment: Needs assistance Sitting-balance support: Feet supported;Bilateral upper extremity supported Sitting balance-Leahy Scale: Fair Sitting balance - Comments: able to sit statically wtihout BUE support intermittently but requires BUE due to fatigue and lack of energy     Standing balance-Leahy Scale: Poor Standing balance comment: requires BUE support                            Cognition Arousal/Alertness: Awake/alert Behavior During Therapy: WFL for tasks assessed/performed Overall Cognitive Status: Within Functional Limits for tasks assessed  Exercises Other Exercises Other Exercises: Static standing ~ 4 min w/ RW, CGA-MIN A d/t BM, RN & OT provided clean up; PT provided facilitory cues for trunk and hip extension which pt was able to achieve intermittently    General Comments         Pertinent Vitals/Pain Pain Assessment: 0-10 Pain Score: 9  Pain Location: R knee w/ WB Pain Descriptors / Indicators: Aching;Discomfort Pain Intervention(s): Limited activity within patient's tolerance;Monitored during session;Repositioned    Home Living                          Prior Function            PT Goals (current goals can now be found in the care plan section) Progress towards PT goals: Progressing toward goals    Frequency    Min 2X/week      PT Plan Current plan remains appropriate    Co-evaluation PT/OT/SLP Co-Evaluation/Treatment: Yes Reason for Co-Treatment: To address functional/ADL transfers PT goals addressed during session: Mobility/safety with mobility OT goals addressed during session: ADL's and self-care      AM-PAC PT "6 Clicks" Mobility   Outcome Measure  Help needed turning from your back to your side while in a flat bed without using bedrails?: A Little Help needed moving from lying on your back to sitting on the side of a flat bed without using bedrails?: A Lot Help needed moving to and from a bed to a chair (including a wheelchair)?: A Lot Help needed standing up from a chair using your arms (e.g., wheelchair or bedside chair)?: A Lot Help needed to walk in hospital room?: A Lot Help needed climbing 3-5 steps with a railing? : Total 6 Click Score: 12    End of Session Equipment Utilized During Treatment: Gait belt Activity Tolerance: Patient tolerated treatment well Patient left: in chair;with call bell/phone within reach;with chair alarm set;with nursing/sitter in room   PT Visit Diagnosis: Other abnormalities of gait and mobility (R26.89);Muscle weakness (generalized) (M62.81);History of falling (Z91.81);Pain     Time: 1003-1017 PT Time Calculation (min) (ACUTE ONLY): 14 min  Charges:                        [employer], SPT

## 2021-08-01 NOTE — Plan of Care (Signed)
  Problem: Education: Goal: Knowledge of General Education information will improve Description: Including pain rating scale, medication(s)/side effects and non-pharmacologic comfort measures Outcome: Progressing   Problem: Coping: Goal: Level of anxiety will decrease Outcome: Progressing   Problem: Elimination: Goal: Will not experience complications related to bowel motility Outcome: Not Progressing   Problem: Skin Integrity: Goal: Risk for impaired skin integrity will decrease Outcome: Not Progressing

## 2021-08-01 NOTE — Progress Notes (Signed)
PROGRESS NOTE    Christopher Collier  PYK:[id number] DOB: 10-22-1945 DOA: 07/20/2021 PCP: Orpah Greek, DO  Assessment & Plan:   Principal Problem:   Generalized weakness Active Problems:   Diabetes mellitus without complication ([place])   Hypertension   GERD (gastroesophageal reflux disease)   Fall at home, initial encounter   Hypokalemia   Rhabdomyolysis   Decubitus ulcer of buttock, stage 3 (HCC)    Fever: etiology unclear. Afebrile > 72 hours. Blood cxs NGTD. [place] 0.36. 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 Continue on fall precautions. 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 Continue on 70/30, SSI w/ accuchecks   Stage III decubitus ulcers on bilateral buttocks: w/ deep tissue injury on the sacrum. 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 W/ mets as per pt. Pt was previously aware of pulmonary nodules as per pt. Management per onco as an outpatient    Minimally displaced right distal clavicular fracture: will f/u outpatient w/ ortho surg   Thrombocytosis: etiology unclear, likely reactive. Will continue to monitor intermittently      DVT prophylaxis: eliquis  Code Status: full  Family Communication: Disposition Plan: still waiting on SNF placement    Level of care: Med-Surg  Status is: Inpatient  Remains inpatient appropriate because: medically stable for d/c and waiting on SNF placement still      Consultants:    Procedures:   Antimicrobials:   Subjective: Pt c/o malaise   Objective: Vitals:   07/31/21 1143 07/31/21 2206 08/01/21 0000 08/01/21 0649  BP: 132/72 135/77 (!) 148/74 137/73  Pulse: 78 68  67 71  Resp: 16 16 18 16   Temp: 98.9 F (37.2 C) 97.7 F (36.5 C) 97.8 F (36.6 C) 98.9 F (37.2 C)  TempSrc: Oral Oral Oral Oral  SpO2: 96% 97% 97% 95%  Weight:      Height:        Intake/Output Summary (Last 24 hours) at 08/01/2021 5397 Last data filed at 07/31/2021 1300 Gross per 24 hour  Intake 360 ml  Output 500 ml  Net -140 ml   Filed Weights   07/20/21 1412 07/22/21 2110  Weight: 86.2 kg 133.8 kg    Examination:  General exam: Appears comfortable but frustrated Respiratory system: clear breath sounds b/l  Cardiovascular system: S1/S2+. No rubs or clicks  Gastrointestinal system: Abd is soft, NT, ND & normal bowel sounds   Central nervous system: alert and oriented. Moves all extremities  Psychiatry: Judgement and insight appears normal. Flat mood and affect    Data Reviewed: I have personally reviewed following labs and imaging studies  CBC: Recent Labs  Lab 07/27/21 0530 07/30/21 0501  WBC 8.3 8.6  NEUTROABS 6.5  --   HGB 9.9* 9.8*  HCT 30.8* 31.8*  MCV 87.5 88.8  PLT 336 673*   Basic Metabolic Panel: Recent Labs  Lab 07/26/21 0829 07/30/21 0501  NA 134* 133*  K 3.5 4.0  CL 99 99  CO2 26 26  GLUCOSE 94 129*  BUN 12 11  CREATININE 0.79 0.78  CALCIUM 8.1* 8.3*   GFR: Estimated Creatinine Clearance: 113.1 mL/min (by C-G formula based on SCr  of 0.78 mg/dL). Liver Function Tests: No results for input(s): AST, ALT, ALKPHOS, BILITOT, PROT, ALBUMIN in the last 168 hours.  No results for input(s): LIPASE, AMYLASE in the last 168 hours. No results for input(s): AMMONIA in the last 168 hours. Coagulation Profile: No results for input(s): INR, PROTIME in the last 168 hours. Cardiac Enzymes: No results for input(s): CKTOTAL, CKMB, CKMBINDEX, TROPONINI in the last 168 hours.  BNP (last 3 results) No results for input(s): PROBNP in the last 8760 hours. HbA1C: No results for input(s): HGBA1C in the last 72 hours. CBG: Recent Labs  Lab  07/30/21 2047 07/31/21 0752 07/31/21 1144 07/31/21 1700 07/31/21 2144  GLUCAP 104* 80 112* 147* 204*   Lipid Profile: No results for input(s): CHOL, HDL, LDLCALC, TRIG, CHOLHDL, LDLDIRECT in the last 72 hours. Thyroid Function Tests: No results for input(s): TSH, T4TOTAL, FREET4, T3FREE, THYROIDAB in the last 72 hours. Anemia Panel: No results for input(s): VITAMINB12, FOLATE, FERRITIN, TIBC, IRON, RETICCTPCT in the last 72 hours. Sepsis Labs: Recent Labs  Lab 07/26/21 0829 07/26/21 0837 07/26/21 1038 07/27/21 0530 07/28/21 0520  PROCALCITON 0.15  --   --  0.36 0.32  LATICACIDVEN  --  0.9 0.8  --   --     Recent Results (from the past 240 hour(s))  CULTURE, BLOOD (ROUTINE X 2) w Reflex to ID Panel     Status: None   Collection Time: 07/26/21  8:37 AM   Specimen: BLOOD  Result Value Ref Range Status   Specimen Description BLOOD [facility]  Final   Special Requests   Final    BOTTLES DRAWN AEROBIC AND ANAEROBIC Blood Culture adequate volume   Culture   Final    NO GROWTH 5 DAYS Performed at [facility], [address] [place], [place] [address]    Report Status 07/31/2021 FINAL  Final  CULTURE, BLOOD (ROUTINE X 2) w Reflex to ID Panel     Status: None   Collection Time: 07/26/21  8:44 AM   Specimen: BLOOD  Result Value Ref Range Status   Specimen Description BLOOD [facility]  Final   Special Requests   Final    BOTTLES DRAWN AEROBIC AND ANAEROBIC Blood Culture results may not be optimal due to an excessive volume of blood received in culture bottles   Culture   Final    NO GROWTH 5 DAYS Performed at [facility], [address] [place], [place] [address]    Report Status 07/31/2021 FINAL  Final         Radiology Studies: No results found.      Scheduled Meds:  apixaban  2.5 mg Oral BID   bicalutamide  50 mg Oral Daily   ferrous sulfate  325 mg Oral QODAY   insulin aspart  0-9 Units Subcutaneous TID WC   insulin aspart protamine- aspart  5  Units Subcutaneous BID WC   liver oil-zinc oxide   Topical TID   metoprolol tartrate  25 mg Oral QAC breakfast   oxybutynin  10 mg Oral QHS   pantoprazole  40 mg Oral Daily   Continuous Infusions:   LOS: 12 days    Time spent: 15 mins     Wyvonnia Dusky, MD [facility] Pager [phone]  If 7PM-7AM, please contact night-coverage 08/01/2021, 7:22 AM

## 2021-08-01 NOTE — TOC Progression Note (Signed)
Transition of Care [facility]) - Progression Note    Patient Details  Name: KEY CEN MRN: [id number] Date of Birth: Apr 08, 1946  Transition of Care [facility]) CM/SW [place], RN Phone Number: 08/01/2021, 4:24 PM  Clinical Narrative:   [facility]ord Spikes in [facility] admissions called, they can accept patient after reviewing during the day.  They will accept patient tomorrow at noon. TOC to follow    Expected Discharge Plan: [place] Barriers to Discharge: Continued Medical Work up  Expected Discharge Plan and Services Expected Discharge Plan: [place]   Discharge Planning Services: CM Consult Post Acute Care Choice: [place] Living arrangements for the past 2 months: Single Family Home                 DME Arranged:  (anticipated to go to SNF)         HH Arranged:  (Anticipated DC to sNF)           Social Determinants of Health (SDOH) Interventions    Readmission Risk Interventions No flowsheet data found.

## 2021-08-01 NOTE — TOC Progression Note (Addendum)
Transition of Care [facility]) - Progression Note    Patient Details  Name: Christopher Collier MRN: [id number] Date of Birth: 15-Apr-1946  Transition of Care [facility]) CM/SW [place], RN Phone Number: 08/01/2021, 8:43 AM  Clinical Narrative:   [place], left message, admission states they do not know the status of the referral at this time.  Admissions states they will call me back today.  Left message for daughter.  Addendum:  [facility] declined patient due to insurance, daughter aware, she has a call out to another [place] facility.  Advised daughter that patient can discharge today, states she would like to wait for other facility to respond prior to making a decision.    Expected Discharge Plan: [place] Barriers to Discharge: Continued Medical Work up  Expected Discharge Plan and Services Expected Discharge Plan: [place]   Discharge Planning Services: CM Consult Post Acute Care Choice: [place] Living arrangements for the past 2 months: Single Family Home                 DME Arranged:  (anticipated to go to SNF)         HH Arranged:  (Anticipated DC to sNF)           Social Determinants of Health (SDOH) Interventions    Readmission Risk Interventions No flowsheet data found.

## 2021-08-01 NOTE — Progress Notes (Signed)
Occupational Therapy Treatment Patient Details Name: Christopher Collier MRN: [id number] DOB: 1946-03-12 Today's Date: 08/01/2021   History of present illness Pt is a 75 y.o. male presenting to hospital 10/26 with generalized weakness (pt laid on floor on Monday and unable to get up since then).  Pt addmited with generalized weaknes/possible fall? (per chart pt fell 3 weeks ago and has R clavicular fx 06/28/21), hypokalemia, DM, and stage III decubitus ulcer with surrounding cellulitis.  PMH includes incontinence, DM, DVT, htn, MRSA, sleep apnea, UTI, and h/o prostate CA.   OT comments  Mr. Noh seen for OT treatment on this date. Upon arrival to room pt awake but reported wanting to sleep. Pt seated upright in bed and agreeable to tx. While pt seated EOB, PT joined for co-treatment.  Pt required MAX A +2 + multimodal cuing for simulated toilet t/f. MAX A for pericare, standing ~2 min. Pt making good progress toward goals. Pt continues to benefit from skilled OT services to maximize return to PLOF and minimize risk of future falls, injury, caregiver burden, and readmission. Will continue to follow POC. Discharge recommendation remains appropriate.     Recommendations for follow up therapy are one component of a multi-disciplinary discharge planning process, led by the attending physician.  Recommendations may be updated based on patient status, additional functional criteria and insurance authorization.    Follow Up Recommendations  Skilled nursing-short term rehab (<3 hours/day)    Assistance Recommended at Discharge Intermittent Supervision/Assistance  Equipment Recommendations  Other (comment) (defer to next venue of care)    Recommendations for Other Services      Precautions / Restrictions Precautions Precautions: Fall Restrictions Weight Bearing Restrictions: Yes RUE Weight Bearing: Weight bearing as tolerated Other Position/Activity Restrictions: Per Dr. Steva Colder ([place]) note 07/18/21 (orthopaedic f/u visit): "I encouraged him to begin gently actively using his right upper extremity as tolerated."       Mobility Bed Mobility Overal bed mobility: Needs Assistance Bed Mobility: Supine to Sit     Supine to sit: Min assist     General bed mobility comments:     Transfers Overall transfer level: Needs assistance Equipment used: Rolling walker (2 wheels) Transfers: Sit to/from Stand Sit to Stand: Max assist;+2 physical assistance;From elevated surface Stand pivot transfers: Max assist;+2 physical assistance               Balance Overall balance assessment: Needs assistance Sitting-balance support: Feet supported;No upper extremity supported Sitting balance-Leahy Scale: Fair Sitting balance - Comments: able to sit statically wtihout BUE support intermittently but requires BUE due to fatigue and lack of energy   Standing balance support: Bilateral upper extremity supported;Reliant on assistive device for balance Standing balance-Leahy Scale: Poor Standing balance comment: requires BUE support                           ADL either performed or assessed with clinical judgement   ADL Overall ADL's : Needs assistance/impaired                                       General ADL Comments: MAX A +2, multimodal cuing for simulated toilet t/f. MAX A for pericare, standing ~2 min.    Extremity/Trunk Assessment              Vision       Perception  Praxis      Cognition Arousal/Alertness: Awake/alert Behavior During Therapy: WFL for tasks assessed/performed Overall Cognitive Status: Within Functional Limits for tasks assessed                                            Exercises Exercises: Other exercises Other Exercises Other Exercises: Pt educ re: OT role, importance of mvmt for functional mobility, safe use of DME Other Exercises: SUP<>sit, static sitting EOB ~ 4 min,  sit<>stand, SPT w/ side steps to chair, pt left w/ PT in room for treatment   Shoulder Instructions       General Comments      Pertinent Vitals/ Pain       Pain Assessment: 0-10 Pain Score: 9  Pain Location: R knee w/ WB Pain Descriptors / Indicators: Aching;Discomfort Pain Intervention(s): Limited activity within patient's tolerance  Home Living Family/patient expects to be discharged to:: Private residence Living Arrangements: Spouse/significant other Available Help at Discharge: Family Type of Home: House Home Access: Ramped entrance     Home Layout: Two level;Bed/bath upstairs                          Prior Functioning/Environment              Frequency  Min 2X/week        Progress Toward Goals  OT Goals(current goals can now be found in the care plan section)  Progress towards OT goals: Progressing toward goals  Acute Rehab OT Goals Patient Stated Goal: to get my breakfast OT Goal Formulation: With patient Time For Goal Achievement: 08/05/21 Potential to Achieve Goals: Fair ADL Goals Pt Will Perform Grooming: sitting;with set-up Pt Will Transfer to Toilet: with mod assist;stand pivot transfer;bedside commode Pt Will Perform Toileting - Clothing Manipulation and hygiene: sit to/from stand;with mod assist Pt/caregiver will Perform Home Exercise Program: Increased strength;Both right and left upper extremity;With theraband;With written HEP provided;With Supervision  Plan Discharge plan remains appropriate;Frequency remains appropriate    Co-evaluation    PT/OT/SLP Co-Evaluation/Treatment: Yes Reason for Co-Treatment: To address functional/ADL transfers PT goals addressed during session: Mobility/safety with mobility OT goals addressed during session: ADL's and self-care      AM-PAC OT "6 Clicks" Daily Activity     Outcome Measure   Help from another person eating meals?: A Little Help from another person taking care of personal  grooming?: A Little Help from another person toileting, which includes using toliet, bedpan, or urinal?: Total Help from another person bathing (including washing, rinsing, drying)?: A Lot Help from another person to put on and taking off regular upper body clothing?: A Lot Help from another person to put on and taking off regular lower body clothing?: Total 6 Click Score: 12    End of Session Equipment Utilized During Treatment: Rolling walker (2 wheels)  OT Visit Diagnosis: Unsteadiness on feet (R26.81);Repeated falls (R29.6);Muscle weakness (generalized) (M62.81);History of falling (Z91.81)   Activity Tolerance Patient tolerated treatment well   Patient Left in chair;Other (comment) (w/ PT in room for treatment)   Nurse Communication          Time: [phone] OT Time Calculation (min): 27 min  Charges: OT General Charges $OT Visit: 1 Visit OT Treatments $Therapeutic Activity: 8-22 mins  Nino Glow, Markus Daft 08/01/2021, 12:24 PM

## 2021-08-01 NOTE — Care Management Important Message (Signed)
Important Message  Patient Details  Name: Christopher Collier MRN: [id number] Date of Birth: 28-Nov-1945   Medicare Important Message Given:  Yes     [name] 08/01/2021, 2:48 PM

## 2021-08-02 DIAGNOSIS — Z794 Long term (current) use of insulin: Secondary | ICD-10-CM

## 2021-08-02 DIAGNOSIS — E1169 Type 2 diabetes mellitus with other specified complication: Secondary | ICD-10-CM

## 2021-08-02 LAB — RESP PANEL BY RT-PCR (FLU A&B, COVID) ARPGX2
Influenza A by PCR: NEGATIVE
Influenza B by PCR: NEGATIVE
SARS Coronavirus 2 by RT PCR: NEGATIVE

## 2021-08-02 LAB — GLUCOSE, CAPILLARY
Glucose-Capillary: 119 mg/dL — ABNORMAL HIGH (ref 70–99)
Glucose-Capillary: 210 mg/dL — ABNORMAL HIGH (ref 70–99)

## 2021-08-02 MED ORDER — ACETAMINOPHEN 325 MG PO TABS: 650.0000 mg | ORAL_TABLET | Freq: Four times a day (QID) | ORAL | Status: AC | PRN

## 2021-08-02 MED ORDER — FERROUS SULFATE 325 (65 FE) MG PO TABS: 325.0000 mg | ORAL_TABLET | ORAL | 3 refills | Status: AC

## 2021-08-02 MED ORDER — ABIRATERONE ACETATE 250 MG PO TABS
1000.0000 mg | ORAL_TABLET | Freq: Every day | ORAL | 0 refills | Status: AC
Start: 2021-08-02 — End: ?

## 2021-08-02 NOTE — Progress Notes (Signed)
Occupational Therapy Treatment Patient Details Name: Christopher Collier MRN: [id number] DOB: 10-09-1945 Today's Date: 08/02/2021   History of present illness Pt is a 75 y.o. male presenting to hospital 10/26 with generalized weakness (pt laid on floor on Monday and unable to get up since then).  Pt addmited with generalized weaknes/possible fall? (per chart pt fell 3 weeks ago and has R clavicular fx 06/28/21), hypokalemia, DM, and stage III decubitus ulcer with surrounding cellulitis.  PMH includes incontinence, DM, DVT, htn, MRSA, sleep apnea, UTI, and h/o prostate CA.   OT comments  Upon entering the room, pt's daughter present in the room and requesting assistance to transfer pt into car to transfer to SNF. OT discussed vehicle with daughter for problem solving. Pt performed supine >sit with max A to EOB. Pt needing mod A initially progressing to min A static sitting balance. Pt with posterior bias. Pt standing with mod A of 2 to stand from EOB. Min A of 2 to transfer into wheelchair with RW. Pt assisted downstairs via wheelchair. Pt transferred into car with same manner as above. No use of RW secondary to tight space. Pt in no distress and no further concern at this time.    Recommendations for follow up therapy are one component of a multi-disciplinary discharge planning process, led by the attending physician.  Recommendations may be updated based on patient status, additional functional criteria and insurance authorization.    Follow Up Recommendations  Skilled nursing-short term rehab (<3 hours/day)    Assistance Recommended at Discharge Intermittent Supervision/Assistance  Equipment Recommendations  Other (comment) (defer to next venue of care)       Precautions / Restrictions Precautions Precautions: Fall Restrictions Weight Bearing Restrictions: Yes RUE Weight Bearing: Weight bearing as tolerated RLE Weight Bearing: Weight bearing as tolerated LLE Weight Bearing: Weight bearing as  tolerated Other Position/Activity Restrictions: Per Dr. Steva Colder ([place]) note 07/18/21 (orthopaedic f/u visit): "I encouraged him to begin gently actively using his right upper extremity as tolerated."       Mobility Bed Mobility Overal bed mobility: Needs Assistance Bed Mobility: Supine to Sit     Supine to sit: Max assist          Transfers Overall transfer level: Needs assistance Equipment used: Rolling walker (2 wheels);2 person hand held assist Transfers: Sit to/from Stand Sit to Stand: Mod assist;+2 physical assistance Stand pivot transfers: Min assist;+2 physical assistance               Balance Overall balance assessment: Needs assistance Sitting-balance support: Feet supported;No upper extremity supported Sitting balance-Leahy Scale: Fair Sitting balance - Comments: able to sit statically wtihout BUE support intermittently but requires BUE due to fatigue and lack of energy   Standing balance support: Bilateral upper extremity supported;Reliant on assistive device for balance Standing balance-Leahy Scale: Poor Standing balance comment: requires BUE support                           ADL either performed or assessed with clinical judgement    Extremity/Trunk Assessment Upper Extremity Assessment Upper Extremity Assessment: RUE deficits/detail RUE Deficits / Details: limited secondary to discomfort from clavicle fx   Lower Extremity Assessment RLE Deficits / Details: limited hip flexion and knee flexion AROM (per pt comfort) RLE: Unable to fully assess due to pain LLE Deficits / Details: limited hip flexion and knee flexion AROM (per pt comfort)   Cervical / Trunk Assessment Cervical /  Trunk Assessment: Kyphotic    Vision Patient Visual Report: No change from baseline            Cognition Arousal/Alertness: Awake/alert Behavior During Therapy: WFL for tasks assessed/performed Overall Cognitive Status: Within  Functional Limits for tasks assessed                                 General Comments: Pt A&Ox4            Frequency  Min 2X/week        Progress Toward Goals  OT Goals(current goals can now be found in the care plan section)  Progress towards OT goals: Progressing toward goals  Acute Rehab OT Goals Patient Stated Goal: to go to rehab OT Goal Formulation: With patient/family Time For Goal Achievement: 08/05/21 Potential to Achieve Goals: [place] Discharge plan remains appropriate;Frequency remains appropriate       AM-PAC OT "6 Clicks" Daily Activity     Outcome Measure   Help from another person eating meals?: A Little Help from another person taking care of personal grooming?: A Little Help from another person toileting, which includes using toliet, bedpan, or urinal?: A Lot Help from another person bathing (including washing, rinsing, drying)?: A Lot Help from another person to put on and taking off regular upper body clothing?: A Lot Help from another person to put on and taking off regular lower body clothing?: Total 6 Click Score: 13    End of Session Equipment Utilized During Treatment: Rolling walker (2 wheels)  OT Visit Diagnosis: Unsteadiness on feet (R26.81);Repeated falls (R29.6);Muscle weakness (generalized) (M62.81);History of falling (Z91.81)   Activity Tolerance Patient tolerated treatment well   Patient Left Other (comment) (transfer to wheelchair and assisted into car)   Nurse Communication Mobility status        Time: 9485-4627 OT Time Calculation (min): 27 min  Charges: OT General Charges $OT Visit: 1 Visit OT Treatments $Therapeutic Activity: 23-37 mins  Darleen Crocker, MS, OTR/L , CBIS ascom [phone]  08/02/21, 3:53 PM

## 2021-08-02 NOTE — TOC Progression Note (Signed)
Transition of Care [facility]) - Progression Note    Patient Details  Name: Christopher Collier MRN: [id number] Date of Birth: 07/03/1946  Transition of Care [facility]) CM/SW [place], RN Phone Number: 08/02/2021, 2:22 PM  Clinical Narrative:  Daughter states she prefers patient to go to [facility] in [place] called the following transportation agencies to transport patient  [facility]:  Cannot transfer patients that far out of county [facility]: not in network with insurance [facility]: Cannot transfer patient due to insurance and availability [facility]: no availability [facility]:  Does not accept patient insurance, if family wants to Psychologist, counselling and file with insurance for reimbursement, they can transport [employer]:  They would only call [facility] or [facility], cannot call others.  Patient's daughter notified of the status of all of these transports.  She states she believes patient has Medicare part B, but this is not in our records at this time.  TOC explained that she is welcome to bring insurance information and we are happy to process, but we do not have record of this.  Physical and occupational therapy state that it would not be safe for patient's family to transport by car, as patient cannot sit up and is a 2+ assist.  Patient's daughter rejected pre paying for transport, states she is leaving at 1330, plans to be at [facility] at 1530 to transport patient.  Patient's daughter was advised against this due to safety and she states she is aware that we do not recommend this mode of travel.  [facility] notified of all of the above, they state they will do the best to prepare for patient's arrival.  [facility] contact information provided, TOC to follow.     Expected Discharge Plan: [place] Barriers to Discharge: Continued Medical Work up  Expected Discharge Plan and Services Expected Discharge Plan: [place]   Discharge Planning Services:  CM Consult Post Acute Care Choice: [place] Living arrangements for the past 2 months: Single Family Home Expected Discharge Date: 08/02/21               DME Arranged:  (anticipated to go to SNF)         HH Arranged:  (Anticipated DC to sNF)           Social Determinants of Health (SDOH) Interventions    Readmission Risk Interventions No flowsheet data found.

## 2021-08-02 NOTE — Discharge Summary (Addendum)
Physician Discharge Summary  Christopher Collier ZOX:[id number] DOB: [birth year] DOA: 07/20/2021  PCP: Orpah Greek, DO  Admit date: 07/20/2021 Discharge date: 08/02/2021  Admitted From: home Disposition:  SNF  Recommendations for Outpatient Follow-up:  Follow up with PCP in 1-2 weeks F/u w/ oncology in 1 week F/u w/ ortho surg in 1-2 weeks    Home Health: no  Equipment/Devices:  Discharge Condition: stable  CODE STATUS: full  Diet recommendation: Carb Modified  Brief/Interim Summary: HPI was taken from Dr. Francine Graven:  Christopher Collier is a 75 y.o. male with medical history significant for prostate cancer history of hypertension, diabetes mellitus, GERD, sleep apnea, history of DVT who was brought into the ER by EMS. Patient states that he laid down on the ground 3 days prior to his presentation and did that for comfort.  He denies having a fall.  He remained on the ground because he was unable to get up.  His wife who was at the bedside said she helped clean him up the best she could while he remained on the floor.  Patient states that he had no had anything to eat in 3 days but has been drinking liquids.  He denies feeling dizzy or lightheaded and denies having any chest pain, no shortness of breath, no abdominal pain, no changes in his bowel habits, no headache, no blurred vision, no focal deficits, no cough, no fever, no chills, no shortness of breath. Labs show sodium 138, potassium 2.7, chloride 96, bicarb 30, glucose 272, BUN 12, creatinine 0.82, calcium 8.1, alkaline phosphatase 128, albumin 2.7, AST 41, ALT 17, total protein 6.8, total CK9 42, troponin 18, white count 10.5, hemoglobin 11.5, hematocrit 34.8, MCV 86.4, RDW 15.2, platelet count 283 Respiratory viral panel is negative Chest x-ray reviewed by me shows no evidence of acute cardiopulmonary disease CT scan of the head without contrast shows no acute intracranial pathology.  Mild parenchymal volume loss and chronic  white matter microangiopathy.   ED Course: Patient is a 75 year old male who presents to the emergency room via EMS for evaluation of generalized weakness. Patient is noted to have stage III ulcers to the medial buttocks with surrounding erythema, induration and differential warmth. Labs show hypokalemia He will be admitted to the hospital for further evaluation.  As per Dr. Mal Misty:   Christopher Collier is a 75 y.o. male with medical history significant for prostate cancer, hypertension, insulin-dependent diabetes mellitus, CAD, OSA, history of recurrent DVT, chronic anemia, who was brought to the hospital because of generalized weakness.  He had been laying on the floor since Monday, 07/18/2021 and had been unable to get up by himself.   He was admitted to the hospital for generalized weakness, hypomagnesemia and hypokalemia.  He was also found to have stage III decubitus ulcers on bilateral buttocks and deep tissue injury on the sacrum.   As per Dr. Jimmye Norman 11/2-11/8/22: Pt did not have any fever during the week that I saw the pt. Pt has remained medically stable this entire week. PT/OT evaluated the pt and recommended SNF. For more information, please see previous progress/consult notes.    Discharge Diagnoses:  Principal Problem:   Generalized weakness Active Problems:   Diabetes mellitus without complication (HCC)   Hypertension   GERD (gastroesophageal reflux disease)   Fall at home, initial encounter   Hypokalemia   Rhabdomyolysis   Decubitus ulcer of buttock, stage 3 (HCC)  Fever: etiology unclear. Afebrile > 72 hours. Blood cxs NGTD. Procal 0.36.  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 Not requiring oxygen, saturating in upper 90s on RA. Resolved    Generalized weakness: s/p fall at home. Continue on fall precautions. Will be d/c to SNF   Hypokalemia: WNL    IDA: continue on iron supplements     DM2: HbA1c 10.6 in 01/2021, poorly controlled. Continue on 70/30   Stage III decubitus ulcers on bilateral buttocks: w/ deep tissue injury on the sacrum. Continue w/ wound care   Possibly traumatic rhabdomyolysis  : resolved    History of recurrent DVT: continue on eliquis    History of prostate cancer: continue casodex. W/ mets as per pt. Pt was previously aware of pulmonary nodules as per pt. Management per onco as an outpatient    Minimally displaced right distal clavicular fracture (06/28/21): will f/u outpatient w/ ortho surg    Thrombocytosis: etiology unclear, likely reactive. Will continue to monitor intermittently   Discharge Instructions  Discharge Instructions     Diet - low sodium heart healthy   Complete by: As directed    Discharge instructions   Complete by: As directed    F/u w/ PCP in 1-2 weeks. F/u w/ oncology in 1-2 weeks   Discharge wound care:   Complete by: As directed    Wound care to Stage 3 pressure injuries at bilateral ischial buttocks:  Cleanse with NS, pat dry. Apply a 1/8 inch layer of Desitin ointment to areas. Apply also to gluteal cleft, perineal and medial thigh areas. Apply three times daily and PRN soiling.   Wound care  Every shift      Comments: Wound care to Deep Tissue Pressure Injury to sacrum: Cleanse with NS, pat dry. Cover with silicone foam bordered dressing. Peel back each shift to assess and document status.   Increase activity slowly   Complete by: As directed       Allergies as of 08/02/2021       Reactions   Benazepril    Angioedema Other reaction(s): Angioedema Angioedema        Medication List     STOP taking these medications    acetaminophen-codeine 300-30 MG tablet Commonly known as: TYLENOL #3   ciprofloxacin 500 MG tablet Commonly known as: Cipro   Uribel 118 MG Caps       TAKE these medications    abiraterone acetate 250 MG tablet Commonly known as: ZYTIGA Take 4 tablets (1,000 mg total) by mouth  daily. Hold this med until you see your oncologist as pt was only taking casodex/bicalutamide as an inpatient at [facility] What changed: additional instructions   acetaminophen 325 MG tablet Commonly known as: TYLENOL Take 2 tablets (650 mg total) by mouth every 6 (six) hours as needed for mild pain, fever or headache (or Fever >/= 101).   bicalutamide 50 MG tablet Commonly known as: CASODEX Take 50 mg by mouth daily.   docusate sodium 100 MG capsule Commonly known as: COLACE Take 2 capsules (200 mg total) by mouth 2 (two) times daily.   Eliquis 2.5 MG Tabs tablet Generic drug: apixaban Take 2.5 mg by mouth 2 (two) times daily.   ferrous sulfate 325 (65 FE) MG tablet Take 1 tablet (325 mg total) by mouth every other day. Start taking on: August 03, 2021   glucose 4 GM chewable tablet Chew 1 tablet by mouth as needed for low blood sugar.   insulin aspart protamine- aspart (70-30) 100 UNIT/ML injection  Commonly known as: NOVOLOG MIX 70/30 Inject 15-30 Units into the skin See admin instructions. Inject 30 units subcutaneously in the morning 15 units in the afternoon and 30 units in the evening   leuprolide (6 Month) 45 MG injection Commonly known as: ELIGARD Inject 45 mg into the skin every 6 (six) months.   MELATONIN PO Take by mouth at bedtime.   metoprolol tartrate 25 MG tablet Commonly known as: LOPRESSOR Take 25 mg by mouth every morning.   omeprazole 20 MG capsule Commonly known as: PRILOSEC Take 20 mg by mouth every morning.   oxybutynin 10 MG 24 hr tablet Commonly known as: DITROPAN-XL Take 10 mg by mouth at bedtime.   predniSONE 5 MG tablet Commonly known as: DELTASONE Take 5 mg by mouth daily with breakfast.   rosuvastatin 5 MG tablet Commonly known as: CRESTOR Take 5 mg by mouth every morning.               Discharge Care Instructions  (From admission, onward)           Start     Ordered   08/02/21 0000  Discharge wound care:        Comments: Wound care to Stage 3 pressure injuries at bilateral ischial buttocks:  Cleanse with NS, pat dry. Apply a 1/8 inch layer of Desitin ointment to areas. Apply also to gluteal cleft, perineal and medial thigh areas. Apply three times daily and PRN soiling.   Wound care  Every shift      Comments: Wound care to Deep Tissue Pressure Injury to sacrum: Cleanse with NS, pat dry. Cover with silicone foam bordered dressing. Peel back each shift to assess and document status.   08/02/21 0945            Allergies  Allergen Reactions   Benazepril     Angioedema Other reaction(s): Angioedema Angioedema    Consultations:    Procedures/Studies: CT HEAD WO CONTRAST (5MM)  Result Date: 07/20/2021 CLINICAL DATA:  Weakness, delirium EXAM: CT HEAD WITHOUT CONTRAST TECHNIQUE: Contiguous axial images were obtained from the base of the skull through the vertex without intravenous contrast. COMPARISON:  None. FINDINGS: Brain: There is no acute intracranial hemorrhage, extra-axial fluid collection, or acute infarct. There is mild global parenchymal volume loss with commensurate enlargement of the ventricular system. Hypodensity in the subcortical and periventricular white matter likely reflects sequela of mild chronic white matter microangiopathy. There is no mass lesion.  There is no midline shift. Vascular: No hyperdense vessel or unexpected calcification. Skull: Normal. Negative for fracture or focal lesion. Sinuses/Orbits: The imaged paranasal sinuses are clear. Bilateral lens implants are in place. The globes and orbits are otherwise unremarkable. Other: None. IMPRESSION: 1. No acute intracranial pathology. 2. Mild parenchymal volume loss and chronic white matter microangiopathy. Electronically Signed   By: Valetta Mole M.D.   On: 07/20/2021 15:13   CT CHEST WO CONTRAST  Result Date: 07/27/2021 CLINICAL DATA:  Pneumonia.  Effusion or abscess suspected.  Fever. EXAM: CT CHEST WITHOUT CONTRAST  TECHNIQUE: Multidetector CT imaging of the chest was performed following the standard protocol without IV contrast. COMPARISON:  Chest radiography yesterday.  CT abdomen 09/17/2020 FINDINGS: Cardiovascular: Heart size is at the upper limits of normal. No pericardial fluid. No visible coronary artery calcification or aortic atherosclerotic calcification. Mediastinum/Nodes: Right hilar and paratracheal adenopathy. Index right paratracheal node axial image 42 measures 12 mm in diameter. Lungs/Pleura: Bilateral pleural effusions layering dependently, larger on the right than the left.  Associated dependent atelectasis. There is minimal emphysematous change in the upper lobes. There are multiple pulmonary nodules consistent with metastatic disease. Left lower lobe axial image 43 measuring 8 mm. Left lower lobe axial image 75 measuring 6 mm. Left lower lobe axial image 91, 2 lesions measuring 6 mm. Left lower lobe axial image 96 measuring 2 cm. Right upper lobe axial image 46 measuring 9 mm. Right lung axial image 51 measuring 4 mm. Right lung axial image 59 measuring 5 mm. Right lower lobe axial image 87 measuring 19 mm. Several other smaller nodules in both lungs. Upper Abdomen: No upper abdominal abnormality identified on this study done without contrast. Musculoskeletal: No fracture or focal bone lesion. IMPRESSION: Bilateral effusions layering dependently with dependent atelectasis, slightly larger on the right than the left. Multiple bilateral pulmonary nodules, the largest approximately 2 cm in both lower lobes, quite likely to represent pulmonary metastatic disease. Electronically Signed   By: Nelson Chimes M.D.   On: 07/27/2021 12:55   DG Chest Port 1 View  Result Date: 07/26/2021 CLINICAL DATA:  Fever. EXAM: PORTABLE CHEST 1 VIEW COMPARISON:  July 20, 2021. FINDINGS: Stable cardiomediastinal silhouette. No pneumothorax is noted. Left lung is clear. Mild right basilar subsegmental atelectasis or infiltrate  is noted. Bony thorax is unremarkable. IMPRESSION: Mild right basilar subsegmental atelectasis or infiltrate is noted. Electronically Signed   By: Marijo Conception M.D.   On: 07/26/2021 09:30   DG Chest Portable 1 View  Result Date: 07/20/2021 CLINICAL DATA:  Weakness EXAM: PORTABLE CHEST 1 VIEW COMPARISON:  Chest radiograph 09/16/2020 FINDINGS: The cardiomediastinal silhouette is within normal limits. There is no focal consolidation or pulmonary edema. There is no pleural effusion or pneumothorax. There is no acute osseous abnormality. IMPRESSION: No radiographic evidence of acute cardiopulmonary process. Electronically Signed   By: Valetta Mole M.D.   On: 07/20/2021 15:11   (Echo, Carotid, EGD, Colonoscopy, ERCP)    Subjective: Pt c/o fatigue    Discharge Exam: Vitals:   08/02/21 0522 08/02/21 0736  BP: 127/69 127/72  Pulse: 74 73  Resp: 19 16  Temp: 98.1 F (36.7 C) 97.8 F (36.6 C)  SpO2: 100% 98%   Vitals:   08/01/21 2052 08/02/21 0020 08/02/21 0522 08/02/21 0736  BP: 137/71 132/68 127/69 127/72  Pulse: 75 70 74 73  Resp: 17 16 19 16   Temp: (!) 97.5 F (36.4 C) 98.4 F (36.9 C) 98.1 F (36.7 C) 97.8 F (36.6 C)  TempSrc: Oral Oral Oral Oral  SpO2: 97% 97% 100% 98%  Weight:      Height:        General: Pt is alert, awake, not in acute distress Cardiovascular: S1/S2 +, no rubs, no gallops Respiratory: CTA bilaterally, no wheezing, no rhonchi Abdominal: Soft, NT, ND, bowel sounds + Extremities:  no cyanosis    The results of significant diagnostics from this hospitalization (including imaging, microbiology, ancillary and laboratory) are listed below for reference.     Microbiology: Recent Results (from the past 240 hour(s))  CULTURE, BLOOD (ROUTINE X 2) w Reflex to ID Panel     Status: None   Collection Time: 07/26/21  8:37 AM   Specimen: BLOOD  Result Value Ref Range Status   Specimen Description BLOOD [facility]  Final   Special Requests   Final    BOTTLES DRAWN  AEROBIC AND ANAEROBIC Blood Culture adequate volume   Culture   Final    NO GROWTH 5 DAYS Performed at [facility],  [place], [place] [address]    Report Status 07/31/2021 FINAL  Final  CULTURE, BLOOD (ROUTINE X 2) w Reflex to ID Panel     Status: None   Collection Time: 07/26/21  8:44 AM   Specimen: BLOOD  Result Value Ref Range Status   Specimen Description BLOOD [facility]  Final   Special Requests   Final    BOTTLES DRAWN AEROBIC AND ANAEROBIC Blood Culture results may not be optimal due to an excessive volume of blood received in culture bottles   Culture   Final    NO GROWTH 5 DAYS Performed at [facility], [address] [place], [place] [address]    Report Status 07/31/2021 FINAL  Final  Resp Panel by RT-PCR (Flu A&B, Covid) Nasopharyngeal Swab     Status: None   Collection Time: 08/02/21  8:11 AM   Specimen: Nasopharyngeal Swab; Nasopharyngeal(NP) swabs in vial transport medium  Result Value Ref Range Status   SARS Coronavirus 2 by RT PCR NEGATIVE NEGATIVE Final    Comment: (NOTE) SARS-CoV-2 target nucleic acids are NOT DETECTED.  The SARS-CoV-2 RNA is generally detectable in upper respiratory specimens during the acute phase of infection. The lowest concentration of SARS-CoV-2 viral copies this assay can detect is 138 copies/mL. A negative result does not preclude SARS-Cov-2 infection and should not be used as the sole basis for treatment or other patient management decisions. A negative result may occur with  improper specimen collection/handling, submission of specimen other than nasopharyngeal swab, presence of viral mutation(s) within the areas targeted by this assay, and inadequate number of viral copies(<138 copies/mL). A negative result must be combined with clinical observations, patient history, and epidemiological information. The expected result is Negative.  Fact Sheet for Patients:   EntrepreneurPulse.com.au  Fact Sheet for Healthcare Providers:  IncredibleEmployment.be  This test is no t yet approved or cleared by the [place] FDA and  has been authorized for detection and/or diagnosis of SARS-CoV-2 by FDA under an Emergency Use Authorization (EUA). This EUA will remain  in effect (meaning this test can be used) for the duration of the COVID-19 declaration under Section 564(b)(1) of the Act, 21 U.S.C.section 360bbb-3(b)(1), unless the authorization is terminated  or revoked sooner.       Influenza A by PCR NEGATIVE NEGATIVE Final   Influenza B by PCR NEGATIVE NEGATIVE Final    Comment: (NOTE) The Xpert Xpress SARS-CoV-2/FLU/RSV plus assay is intended as an aid in the diagnosis of influenza from Nasopharyngeal swab specimens and should not be used as a sole basis for treatment. Nasal washings and aspirates are unacceptable for Xpert Xpress SARS-CoV-2/FLU/RSV testing.  Fact Sheet for Patients: EntrepreneurPulse.com.au  Fact Sheet for Healthcare Providers: IncredibleEmployment.be  This test is not yet approved or cleared by the [place] FDA and has been authorized for detection and/or diagnosis of SARS-CoV-2 by FDA under an Emergency Use Authorization (EUA). This EUA will remain in effect (meaning this test can be used) for the duration of the COVID-19 declaration under Section 564(b)(1) of the Act, 21 U.S.C. section 360bbb-3(b)(1), unless the authorization is terminated or revoked.  Performed at [facility], [address] [place], [place] [address]      Labs: BNP (last 3 results) Recent Labs    09/16/20 2246  BNP 99.3   Basic Metabolic Panel: Recent Labs  Lab 07/30/21 0501  NA 133*  K 4.0  CL 99  CO2 26  GLUCOSE 129*  BUN 11  CREATININE 0.78  CALCIUM 8.3*   Liver Function Tests: No results for input(s): AST, ALT, ALKPHOS, BILITOT, PROT,  ALBUMIN in the last 168 hours. No results for input(s): LIPASE, AMYLASE in the last 168 hours. No results for input(s): AMMONIA in the last 168 hours. CBC: Recent Labs  Lab 07/27/21 0530 07/30/21 0501  WBC 8.3 8.6  NEUTROABS 6.5  --   HGB 9.9* 9.8*  HCT 30.8* 31.8*  MCV 87.5 88.8  PLT 336 431*   Cardiac Enzymes: No results for input(s): CKTOTAL, CKMB, CKMBINDEX, TROPONINI in the last 168 hours. BNP: Invalid input(s): POCBNP CBG: Recent Labs  Lab 08/01/21 0758 08/01/21 1228 08/01/21 1635 08/01/21 2100 08/02/21 0739  GLUCAP 125* 128* 100* 108* 119*   D-Dimer No results for input(s): DDIMER in the last 72 hours. Hgb A1c No results for input(s): HGBA1C in the last 72 hours. Lipid Profile No results for input(s): CHOL, HDL, LDLCALC, TRIG, CHOLHDL, LDLDIRECT in the last 72 hours. Thyroid function studies No results for input(s): TSH, T4TOTAL, T3FREE, THYROIDAB in the last 72 hours.  Invalid input(s): FREET3 Anemia work up No results for input(s): VITAMINB12, FOLATE, FERRITIN, TIBC, IRON, RETICCTPCT in the last 72 hours. Urinalysis    Component Value Date/Time   COLORURINE YELLOW (A) 07/20/2021 1719   APPEARANCEUR CLEAR (A) 07/20/2021 1719   LABSPEC 1.011 07/20/2021 1719   PHURINE 6.0 07/20/2021 1719   GLUCOSEU >=500 (A) 07/20/2021 1719   HGBUR MODERATE (A) 07/20/2021 1719   BILIRUBINUR NEGATIVE 07/20/2021 1719   KETONESUR 20 (A) 07/20/2021 1719   PROTEINUR 100 (A) 07/20/2021 1719   NITRITE NEGATIVE 07/20/2021 1719   LEUKOCYTESUR NEGATIVE 07/20/2021 1719   Sepsis Labs Invalid input(s): PROCALCITONIN,  WBC,  LACTICIDVEN Microbiology Recent Results (from the past 240 hour(s))  CULTURE, BLOOD (ROUTINE X 2) w Reflex to ID Panel     Status: None   Collection Time: 07/26/21  8:37 AM   Specimen: BLOOD  Result Value Ref Range Status   Specimen Description BLOOD [facility]  Final   Special Requests   Final    BOTTLES DRAWN AEROBIC AND ANAEROBIC Blood Culture adequate volume    Culture   Final    NO GROWTH 5 DAYS Performed at [facility], [address] [place], [place] [address]    Report Status 07/31/2021 FINAL  Final  CULTURE, BLOOD (ROUTINE X 2) w Reflex to ID Panel     Status: None   Collection Time: 07/26/21  8:44 AM   Specimen: BLOOD  Result Value Ref Range Status   Specimen Description BLOOD [facility]  Final   Special Requests   Final    BOTTLES DRAWN AEROBIC AND ANAEROBIC Blood Culture results may not be optimal due to an excessive volume of blood received in culture bottles   Culture   Final    NO GROWTH 5 DAYS Performed at [facility], [address] [place], [place] [address]    Report Status 07/31/2021 FINAL  Final  Resp Panel by RT-PCR (Flu A&B, Covid) Nasopharyngeal Swab     Status: None   Collection Time: 08/02/21  8:11 AM   Specimen: Nasopharyngeal Swab; Nasopharyngeal(NP) swabs in vial transport medium  Result Value Ref Range Status   SARS Coronavirus 2 by RT PCR NEGATIVE NEGATIVE Final    Comment: (NOTE) SARS-CoV-2 target nucleic acids are NOT DETECTED.  The SARS-CoV-2 RNA is generally detectable in upper respiratory specimens during the acute phase of infection. The lowest concentration of SARS-CoV-2 viral copies this assay  can detect is 138 copies/mL. A negative result does not preclude SARS-Cov-2 infection and should not be used as the sole basis for treatment or other patient management decisions. A negative result may occur with  improper specimen collection/handling, submission of specimen other than nasopharyngeal swab, presence of viral mutation(s) within the areas targeted by this assay, and inadequate number of viral copies(<138 copies/mL). A negative result must be combined with clinical observations, patient history, and epidemiological information. The expected result is Negative.  Fact Sheet for Patients:  EntrepreneurPulse.com.au  Fact Sheet for Healthcare Providers:   IncredibleEmployment.be  This test is no t yet approved or cleared by the [place] FDA and  has been authorized for detection and/or diagnosis of SARS-CoV-2 by FDA under an Emergency Use Authorization (EUA). This EUA will remain  in effect (meaning this test can be used) for the duration of the COVID-19 declaration under Section 564(b)(1) of the Act, 21 U.S.C.section 360bbb-3(b)(1), unless the authorization is terminated  or revoked sooner.       Influenza A by PCR NEGATIVE NEGATIVE Final   Influenza B by PCR NEGATIVE NEGATIVE Final    Comment: (NOTE) The Xpert Xpress SARS-CoV-2/FLU/RSV plus assay is intended as an aid in the diagnosis of influenza from Nasopharyngeal swab specimens and should not be used as a sole basis for treatment. Nasal washings and aspirates are unacceptable for Xpert Xpress SARS-CoV-2/FLU/RSV testing.  Fact Sheet for Patients: EntrepreneurPulse.com.au  Fact Sheet for Healthcare Providers: IncredibleEmployment.be  This test is not yet approved or cleared by the [place] FDA and has been authorized for detection and/or diagnosis of SARS-CoV-2 by FDA under an Emergency Use Authorization (EUA). This EUA will remain in effect (meaning this test can be used) for the duration of the COVID-19 declaration under Section 564(b)(1) of the Act, 21 U.S.C. section 360bbb-3(b)(1), unless the authorization is terminated or revoked.  Performed at [facility], [address], [place], [place] [address]      Time coordinating discharge: Over 30 minutes  SIGNED:   Wyvonnia Dusky, MD  [facility] 08/02/2021, 11:23 AM Pager   If 7PM-7AM, please contact night-coverage

## 2021-10-26 DEATH — deceased

## 2021-11-06 IMAGING — DX DG CHEST 1V PORT
1 series · 1 of 1 positions shown · non-contrast
Comparison: Chest radiograph 09/16/2020

CLINICAL DATA: Weakness

EXAM:
PORTABLE CHEST 1 VIEW

[chest ap]
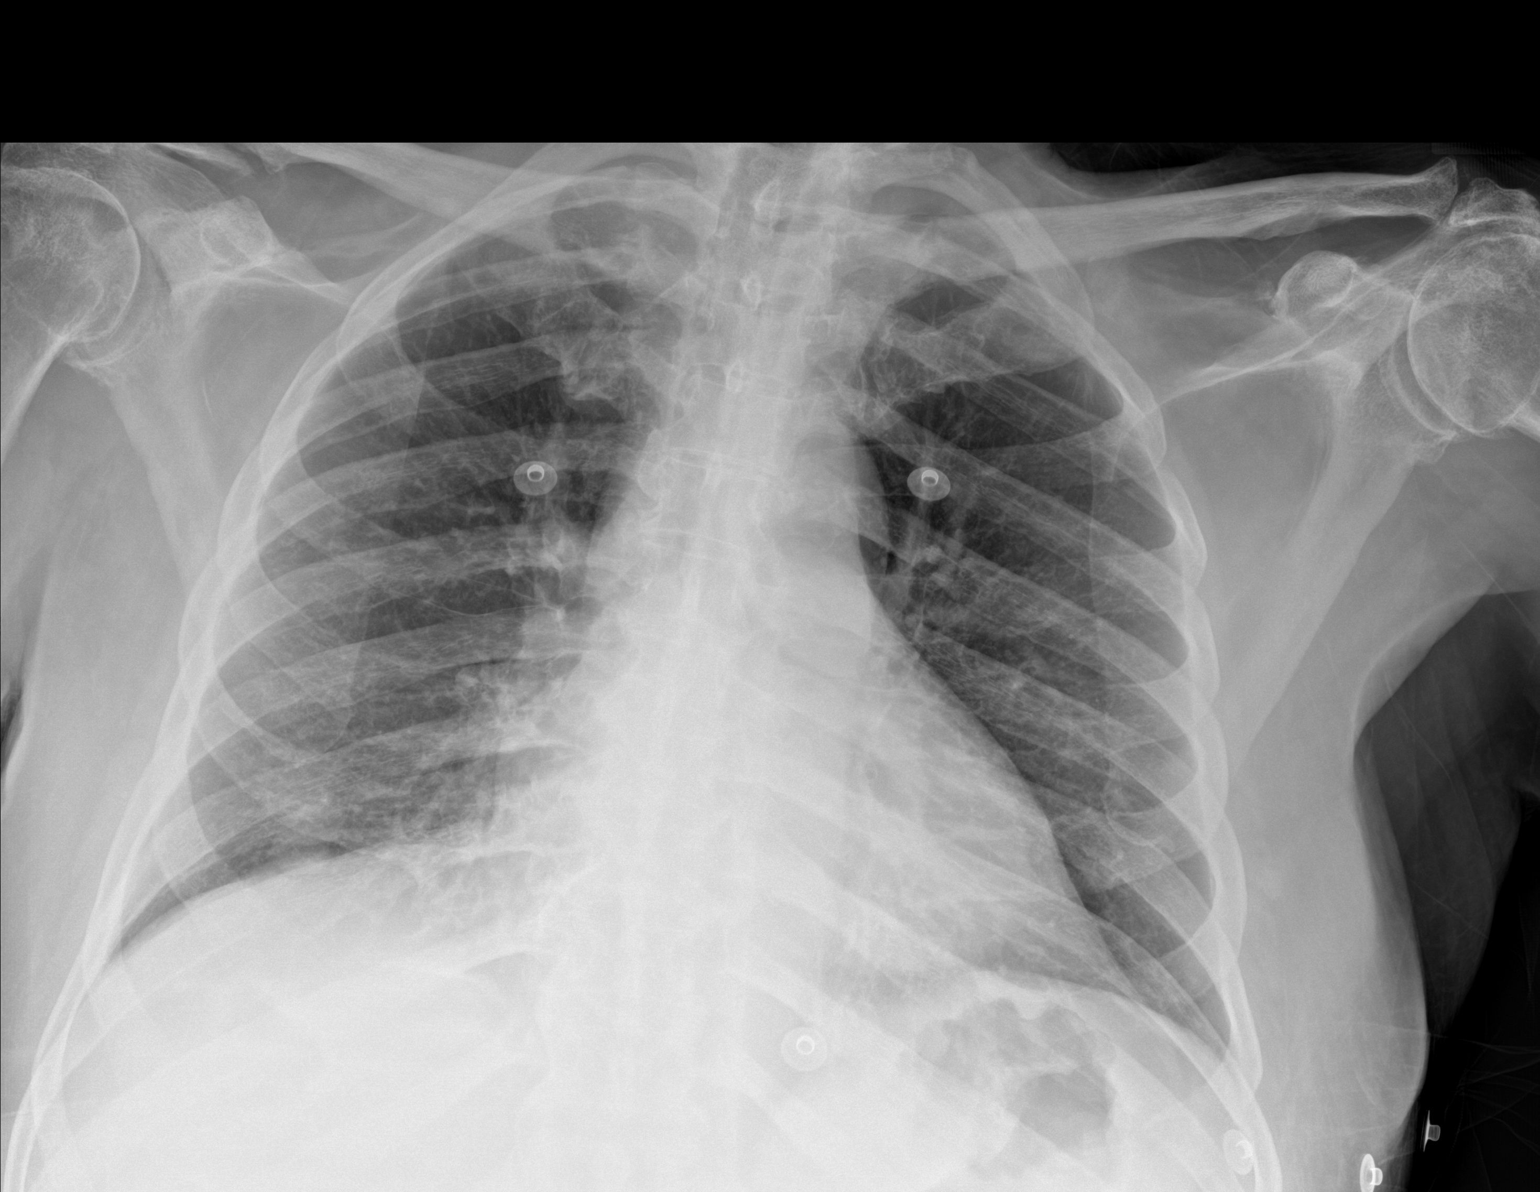

[1 of 1 positions shown; findings below may reference images not displayed]

FINDINGS: The cardiomediastinal silhouette is within normal limits.

There is no focal consolidation or pulmonary edema. There is no
pleural effusion or pneumothorax.

There is no acute osseous abnormality.
IMPRESSION: No radiographic evidence of acute cardiopulmonary process.

## 2021-11-06 IMAGING — CT CT HEAD W/O CM
3 series · 16 of 47 positions shown, 19 images · non-contrast
Comparison: None.

CLINICAL DATA: Weakness, delirium

EXAM:
CT HEAD WITHOUT CONTRAST
TECHNIQUE: Contiguous axial images were obtained from the base of the skull
through the vertex without intravenous contrast.

[Series 2: head wo · axial · 0.44mm/px · z∈[-91,+54]mm · 10 of 35 slices shown, 13 images]
[im 3/35  brain]
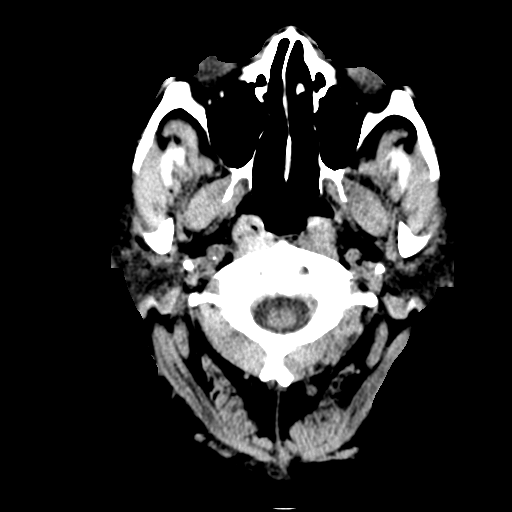
[im 3/35  bone]
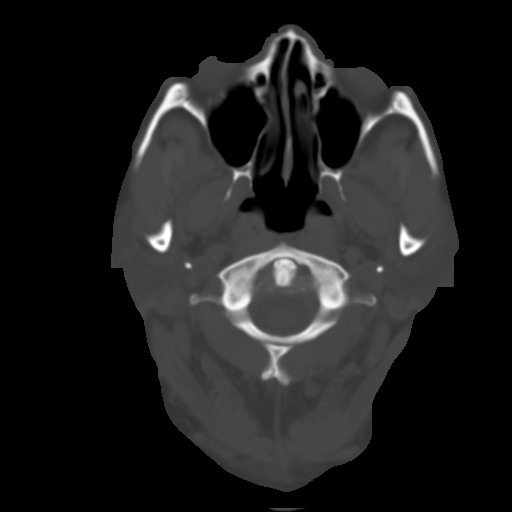
[im 6/35  brain]
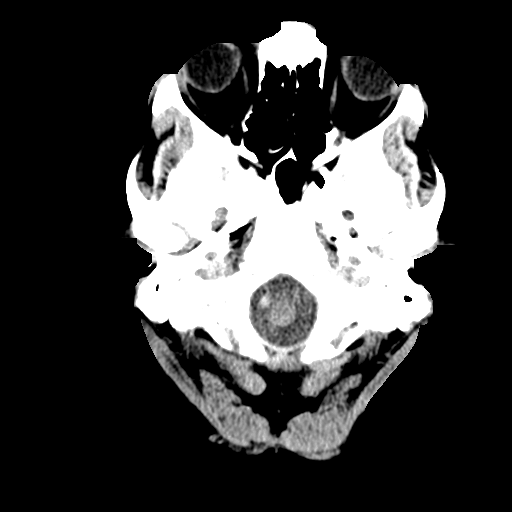
[im 10/35  brain]
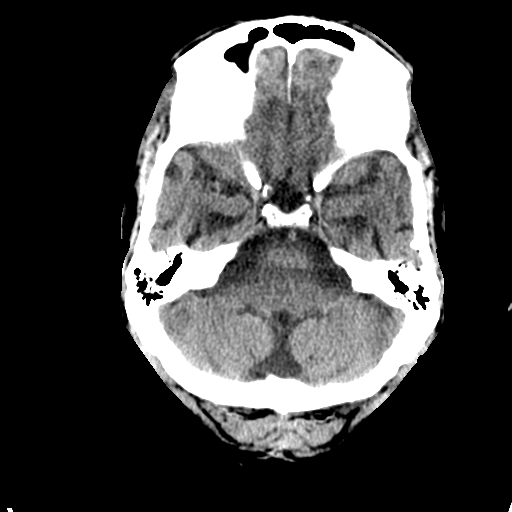
[im 12/35  brain]
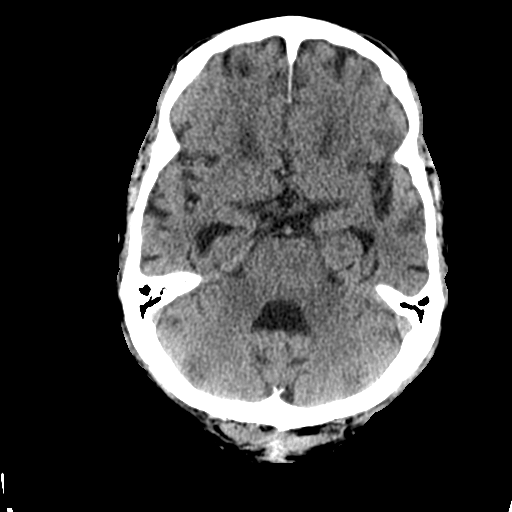
[im 16/35  brain]
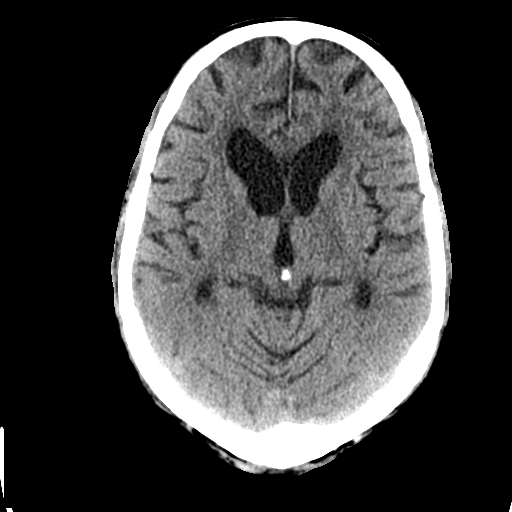
[im 16/35  bone]
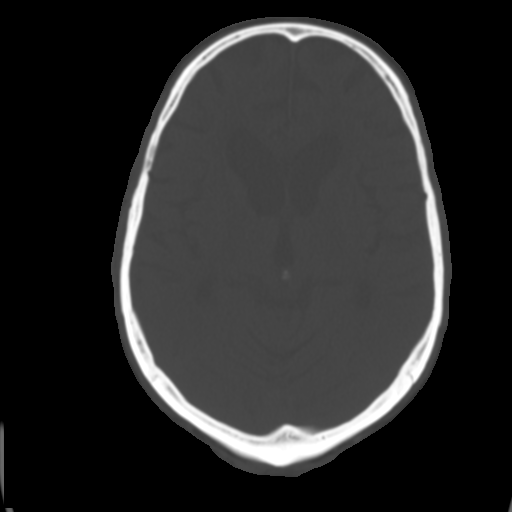
[im 19/35  brain]
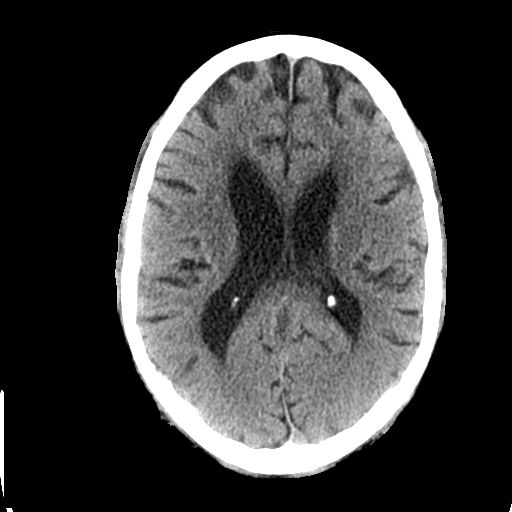
[im 23/35  brain]
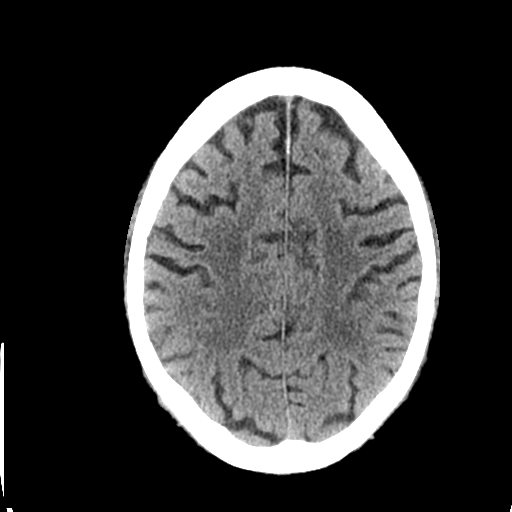
[im 26/35  brain]
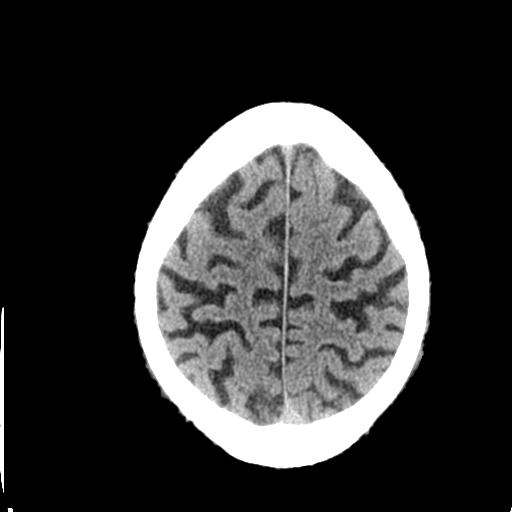
[im 29/35  brain]
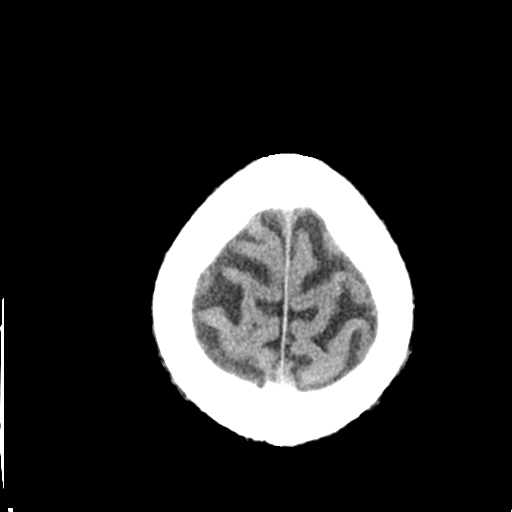
[im 29/35  bone]
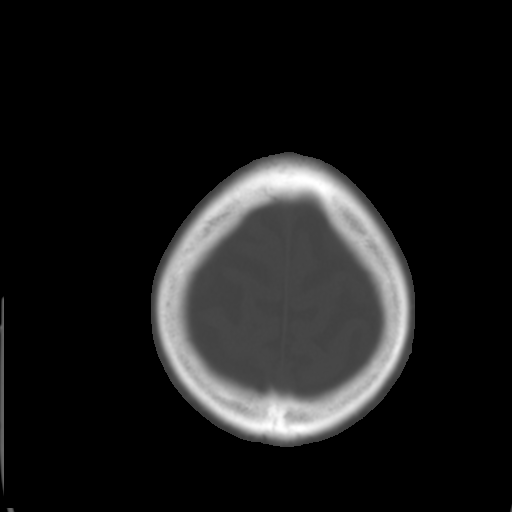
[im 32/35  brain]
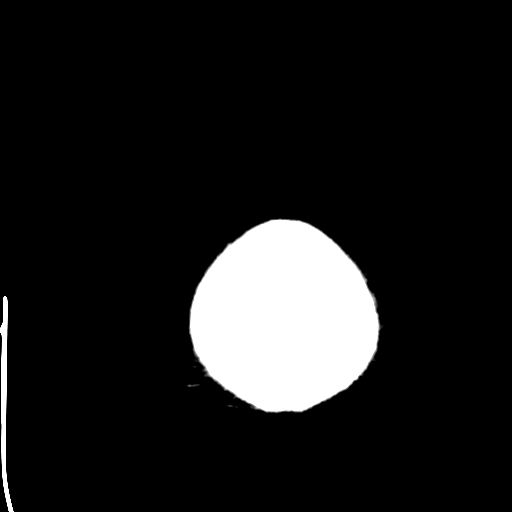

[Series 4: coronal soft tissue · coronal · 0.32mm/px · 3 of 70 slices shown]
[im 24/70  brain]
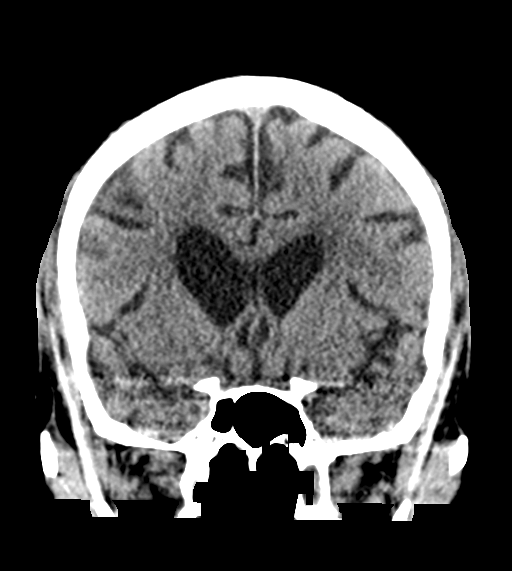
[im 31/70  brain]
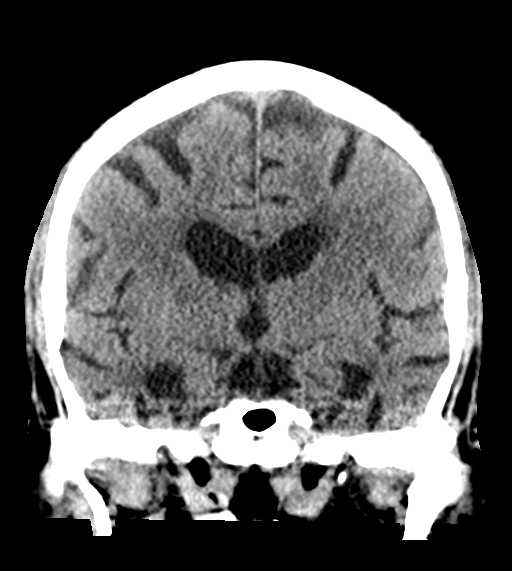
[im 39/70  brain]
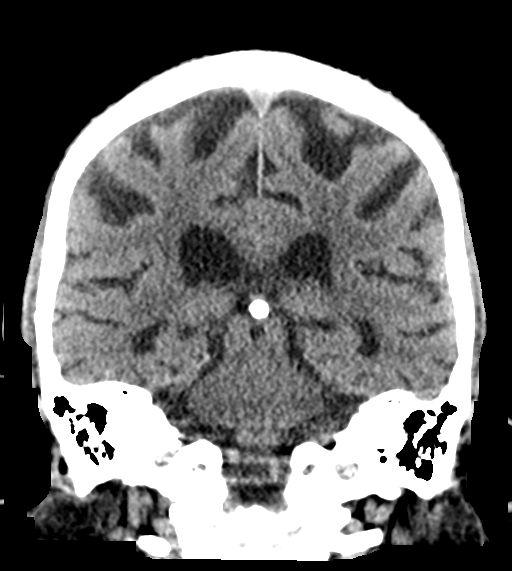

[Series 5: sagittal soft tissue · sagittal · 0.36mm/px · 3 of 56 slices shown]
[im 19/56  brain]
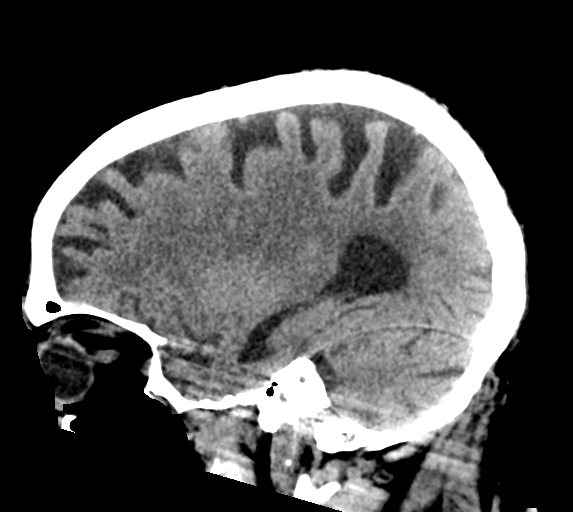
[im 28/56  brain]
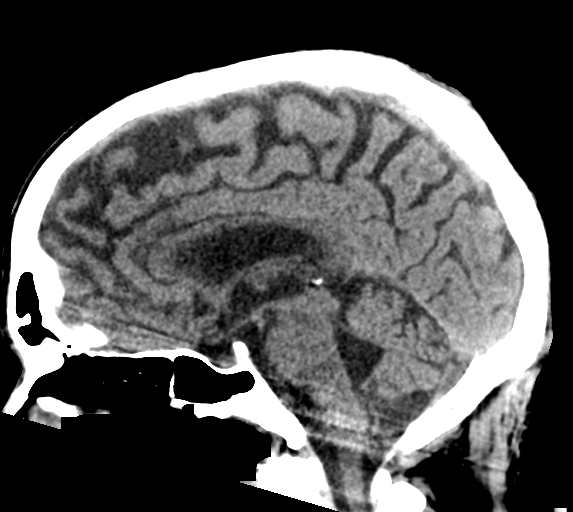
[im 37/56  brain]
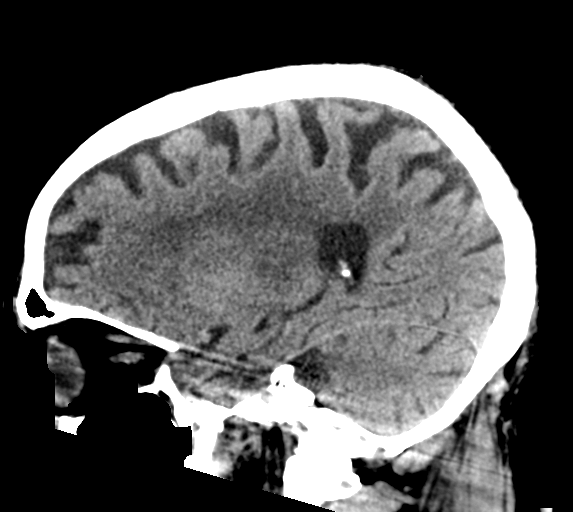

[16 of 47 positions shown; findings below may reference images not displayed]

FINDINGS: Brain: There is no acute intracranial hemorrhage, extra-axial fluid
collection, or acute infarct.

There is mild global parenchymal volume loss with commensurate
enlargement of the ventricular system. Hypodensity in the
subcortical and periventricular white matter likely reflects sequela
of mild chronic white matter microangiopathy.

There is no mass lesion.  There is no midline shift.

Vascular: No hyperdense vessel or unexpected calcification.

Skull: Normal. Negative for fracture or focal lesion.

Sinuses/Orbits: The imaged paranasal sinuses are clear. Bilateral
lens implants are in place. The globes and orbits are otherwise
unremarkable.

Other: None.
IMPRESSION: 1. No acute intracranial pathology.
2. Mild parenchymal volume loss and chronic white matter
microangiopathy.
